# Patient Record
Sex: Female | Born: 1935 | Race: White | Hispanic: No | Smoking: Never smoker
Health system: Southern US, Community
[De-identification: ages and names within clinical notes are randomized; demographics above are authoritative.]

## PROBLEM LIST (undated history)

## (undated) DIAGNOSIS — I495 Sick sinus syndrome: Secondary | ICD-10-CM

## (undated) DIAGNOSIS — C649 Malignant neoplasm of unspecified kidney, except renal pelvis: Secondary | ICD-10-CM

## (undated) DIAGNOSIS — I629 Nontraumatic intracranial hemorrhage, unspecified: Secondary | ICD-10-CM

## (undated) DIAGNOSIS — E785 Hyperlipidemia, unspecified: Secondary | ICD-10-CM

## (undated) DIAGNOSIS — I341 Nonrheumatic mitral (valve) prolapse: Secondary | ICD-10-CM

## (undated) DIAGNOSIS — I4821 Permanent atrial fibrillation: Secondary | ICD-10-CM

## (undated) DIAGNOSIS — K279 Peptic ulcer, site unspecified, unspecified as acute or chronic, without hemorrhage or perforation: Secondary | ICD-10-CM

## (undated) DIAGNOSIS — M1990 Unspecified osteoarthritis, unspecified site: Secondary | ICD-10-CM

## (undated) DIAGNOSIS — I69959 Hemiplegia and hemiparesis following unspecified cerebrovascular disease affecting unspecified side: Secondary | ICD-10-CM

## (undated) DIAGNOSIS — I10 Essential (primary) hypertension: Secondary | ICD-10-CM

## (undated) DIAGNOSIS — I350 Nonrheumatic aortic (valve) stenosis: Secondary | ICD-10-CM

## (undated) DIAGNOSIS — E039 Hypothyroidism, unspecified: Secondary | ICD-10-CM

## (undated) HISTORY — PX: NEPHRECTOMY: SHX65

## (undated) HISTORY — DX: Unspecified osteoarthritis, unspecified site: M19.90

## (undated) HISTORY — DX: Malignant neoplasm of unspecified kidney, except renal pelvis: C64.9

## (undated) HISTORY — DX: Nontraumatic intracranial hemorrhage, unspecified: I62.9

## (undated) HISTORY — DX: Nonrheumatic aortic (valve) stenosis: I35.0

## (undated) HISTORY — PX: BACK SURGERY: SHX140

## (undated) HISTORY — DX: Essential (primary) hypertension: I10

## (undated) HISTORY — DX: Hyperlipidemia, unspecified: E78.5

## (undated) HISTORY — DX: Hemiplegia and hemiparesis following unspecified cerebrovascular disease affecting unspecified side: I69.959

## (undated) HISTORY — DX: Peptic ulcer, site unspecified, unspecified as acute or chronic, without hemorrhage or perforation: K27.9

## (undated) HISTORY — PX: TONSILLECTOMY: SUR1361

## (undated) HISTORY — DX: Sick sinus syndrome: I49.5

## (undated) HISTORY — DX: Nonrheumatic mitral (valve) prolapse: I34.1

## (undated) HISTORY — DX: Hypothyroidism, unspecified: E03.9

## (undated) HISTORY — DX: Permanent atrial fibrillation: I48.21

---

## 1998-08-21 ENCOUNTER — Ambulatory Visit (HOSPITAL_COMMUNITY): Admission: RE | Admit: 1998-08-21 | Discharge: 1998-08-21 | Payer: Self-pay | Admitting: Cardiology

## 1998-11-16 ENCOUNTER — Encounter: Payer: Self-pay | Admitting: Emergency Medicine

## 1998-11-17 ENCOUNTER — Observation Stay (HOSPITAL_COMMUNITY): Admission: EM | Admit: 1998-11-17 | Discharge: 1998-11-18 | Payer: Self-pay | Admitting: Emergency Medicine

## 1999-06-18 ENCOUNTER — Emergency Department (HOSPITAL_COMMUNITY): Admission: EM | Admit: 1999-06-18 | Discharge: 1999-06-18 | Payer: Self-pay | Admitting: *Deleted

## 1999-06-22 ENCOUNTER — Inpatient Hospital Stay (HOSPITAL_COMMUNITY): Admission: EM | Admit: 1999-06-22 | Discharge: 1999-06-23 | Payer: Self-pay | Admitting: Emergency Medicine

## 1999-06-22 ENCOUNTER — Encounter: Payer: Self-pay | Admitting: Emergency Medicine

## 2000-06-02 ENCOUNTER — Emergency Department (HOSPITAL_COMMUNITY): Admission: EM | Admit: 2000-06-02 | Discharge: 2000-06-02 | Payer: Self-pay | Admitting: Emergency Medicine

## 2000-06-05 ENCOUNTER — Encounter: Payer: Self-pay | Admitting: Internal Medicine

## 2000-06-05 ENCOUNTER — Encounter: Payer: Self-pay | Admitting: Emergency Medicine

## 2000-06-05 ENCOUNTER — Encounter (INDEPENDENT_AMBULATORY_CARE_PROVIDER_SITE_OTHER): Payer: Self-pay | Admitting: *Deleted

## 2000-06-05 ENCOUNTER — Inpatient Hospital Stay (HOSPITAL_COMMUNITY): Admission: EM | Admit: 2000-06-05 | Discharge: 2000-06-14 | Payer: Self-pay | Admitting: Emergency Medicine

## 2000-06-08 ENCOUNTER — Encounter: Payer: Self-pay | Admitting: Endocrinology

## 2000-11-09 ENCOUNTER — Inpatient Hospital Stay (HOSPITAL_COMMUNITY): Admission: RE | Admit: 2000-11-09 | Discharge: 2000-11-10 | Payer: Self-pay | Admitting: Orthopaedic Surgery

## 2001-01-06 ENCOUNTER — Emergency Department (HOSPITAL_COMMUNITY): Admission: EM | Admit: 2001-01-06 | Discharge: 2001-01-06 | Payer: Self-pay | Admitting: Emergency Medicine

## 2001-01-06 ENCOUNTER — Encounter: Payer: Self-pay | Admitting: Emergency Medicine

## 2001-02-09 ENCOUNTER — Encounter: Payer: Self-pay | Admitting: Urology

## 2001-02-09 ENCOUNTER — Encounter: Admission: RE | Admit: 2001-02-09 | Discharge: 2001-02-09 | Payer: Self-pay | Admitting: Urology

## 2002-01-26 ENCOUNTER — Encounter: Payer: Self-pay | Admitting: Urology

## 2002-01-26 ENCOUNTER — Encounter: Admission: RE | Admit: 2002-01-26 | Discharge: 2002-01-26 | Payer: Self-pay | Admitting: Urology

## 2002-12-20 ENCOUNTER — Ambulatory Visit (HOSPITAL_COMMUNITY): Admission: RE | Admit: 2002-12-20 | Discharge: 2002-12-20 | Payer: Self-pay | Admitting: Urology

## 2002-12-20 ENCOUNTER — Encounter: Payer: Self-pay | Admitting: Urology

## 2003-01-30 ENCOUNTER — Encounter: Admission: RE | Admit: 2003-01-30 | Discharge: 2003-01-30 | Payer: Self-pay | Admitting: Endocrinology

## 2003-01-30 ENCOUNTER — Encounter: Payer: Self-pay | Admitting: Endocrinology

## 2004-01-07 ENCOUNTER — Ambulatory Visit (HOSPITAL_COMMUNITY): Admission: RE | Admit: 2004-01-07 | Discharge: 2004-01-07 | Payer: Self-pay | Admitting: *Deleted

## 2004-01-07 ENCOUNTER — Encounter (INDEPENDENT_AMBULATORY_CARE_PROVIDER_SITE_OTHER): Payer: Self-pay | Admitting: Specialist

## 2004-02-20 ENCOUNTER — Encounter: Admission: RE | Admit: 2004-02-20 | Discharge: 2004-02-20 | Payer: Self-pay | Admitting: Endocrinology

## 2004-05-21 ENCOUNTER — Ambulatory Visit (HOSPITAL_COMMUNITY): Admission: RE | Admit: 2004-05-21 | Discharge: 2004-05-21 | Payer: Self-pay | Admitting: *Deleted

## 2005-03-11 ENCOUNTER — Ambulatory Visit (HOSPITAL_COMMUNITY): Admission: RE | Admit: 2005-03-11 | Discharge: 2005-03-11 | Payer: Self-pay | Admitting: *Deleted

## 2005-03-11 ENCOUNTER — Encounter (INDEPENDENT_AMBULATORY_CARE_PROVIDER_SITE_OTHER): Payer: Self-pay | Admitting: *Deleted

## 2005-05-20 ENCOUNTER — Ambulatory Visit (HOSPITAL_COMMUNITY): Admission: RE | Admit: 2005-05-20 | Discharge: 2005-05-20 | Payer: Self-pay | Admitting: Urology

## 2006-05-23 ENCOUNTER — Ambulatory Visit (HOSPITAL_COMMUNITY): Admission: RE | Admit: 2006-05-23 | Discharge: 2006-05-23 | Payer: Self-pay | Admitting: Urology

## 2006-06-17 ENCOUNTER — Emergency Department (HOSPITAL_COMMUNITY): Admission: EM | Admit: 2006-06-17 | Discharge: 2006-06-17 | Payer: Self-pay | Admitting: Emergency Medicine

## 2006-06-23 ENCOUNTER — Emergency Department (HOSPITAL_COMMUNITY): Admission: EM | Admit: 2006-06-23 | Discharge: 2006-06-23 | Payer: Self-pay | Admitting: Emergency Medicine

## 2006-07-28 ENCOUNTER — Emergency Department (HOSPITAL_COMMUNITY): Admission: EM | Admit: 2006-07-28 | Discharge: 2006-07-28 | Payer: Self-pay | Admitting: Emergency Medicine

## 2006-08-18 ENCOUNTER — Inpatient Hospital Stay (HOSPITAL_COMMUNITY): Admission: EM | Admit: 2006-08-18 | Discharge: 2006-08-24 | Payer: Self-pay | Admitting: Emergency Medicine

## 2006-08-18 ENCOUNTER — Encounter (INDEPENDENT_AMBULATORY_CARE_PROVIDER_SITE_OTHER): Payer: Self-pay | Admitting: Cardiology

## 2006-08-19 ENCOUNTER — Encounter (INDEPENDENT_AMBULATORY_CARE_PROVIDER_SITE_OTHER): Payer: Self-pay | Admitting: Neurology

## 2006-08-24 ENCOUNTER — Inpatient Hospital Stay (HOSPITAL_COMMUNITY)
Admission: RE | Admit: 2006-08-24 | Discharge: 2006-09-02 | Payer: Self-pay | Admitting: Physical Medicine & Rehabilitation

## 2006-08-24 ENCOUNTER — Ambulatory Visit: Payer: Self-pay | Admitting: Physical Medicine & Rehabilitation

## 2006-09-21 ENCOUNTER — Emergency Department (HOSPITAL_COMMUNITY): Admission: EM | Admit: 2006-09-21 | Discharge: 2006-09-21 | Payer: Self-pay | Admitting: Emergency Medicine

## 2006-09-26 ENCOUNTER — Emergency Department (HOSPITAL_COMMUNITY): Admission: EM | Admit: 2006-09-26 | Discharge: 2006-09-26 | Payer: Self-pay | Admitting: Emergency Medicine

## 2006-09-26 ENCOUNTER — Ambulatory Visit (HOSPITAL_COMMUNITY): Admission: RE | Admit: 2006-09-26 | Discharge: 2006-09-26 | Payer: Self-pay | Admitting: *Deleted

## 2006-10-12 ENCOUNTER — Encounter
Admission: RE | Admit: 2006-10-12 | Discharge: 2007-01-10 | Payer: Self-pay | Admitting: Physical Medicine & Rehabilitation

## 2006-10-12 ENCOUNTER — Ambulatory Visit: Payer: Self-pay | Admitting: Physical Medicine & Rehabilitation

## 2006-11-24 ENCOUNTER — Encounter (INDEPENDENT_AMBULATORY_CARE_PROVIDER_SITE_OTHER): Payer: Self-pay | Admitting: *Deleted

## 2006-11-24 ENCOUNTER — Ambulatory Visit (HOSPITAL_COMMUNITY): Admission: RE | Admit: 2006-11-24 | Discharge: 2006-11-24 | Payer: Self-pay | Admitting: *Deleted

## 2007-03-06 ENCOUNTER — Ambulatory Visit (HOSPITAL_COMMUNITY): Admission: RE | Admit: 2007-03-06 | Discharge: 2007-03-06 | Payer: Self-pay | Admitting: Urology

## 2007-04-30 IMAGING — CT CT ANGIO CHEST
2 of 5 series · 19 of 36 positions shown · IV contrast (120 ML OMNI 300)
Comparison: none

CLINICAL DATA: Left chest pain.  Elevated D-Dimer.  Evaluate for pulmonary embolus.  
 CT ANGIOGRAPHY OF CHEST:
TECHNIQUE: Multidetector CT imaging of the chest was performed during bolus injection of intravenous contrast.  Multiplanar CT angiographic image reconstructions were generated to evaluate the vascular anatomy.
 Contrast:  83 ml Omnipaque 300 IV.

[Series 3: pe · axial · 0.72mm/px · z∈[-253,+21]mm · 16 of 249 slices shown]
[im 15/249  lung]
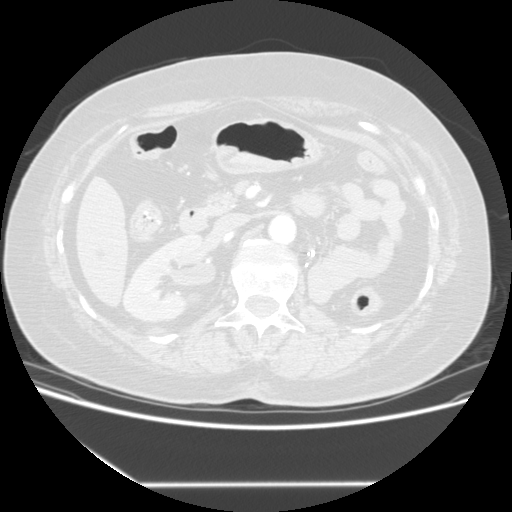
[im 30/249  mediastinal]
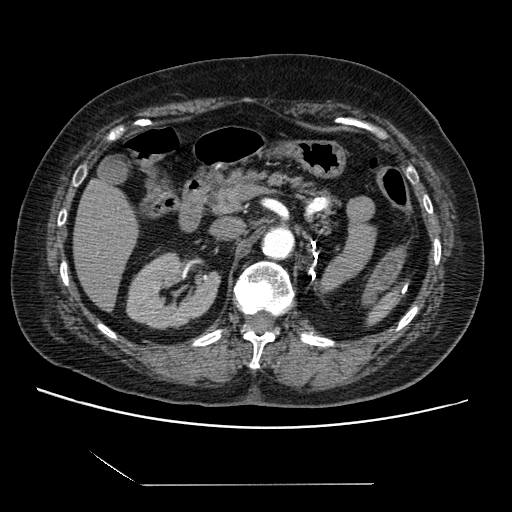
[im 44/249  lung]
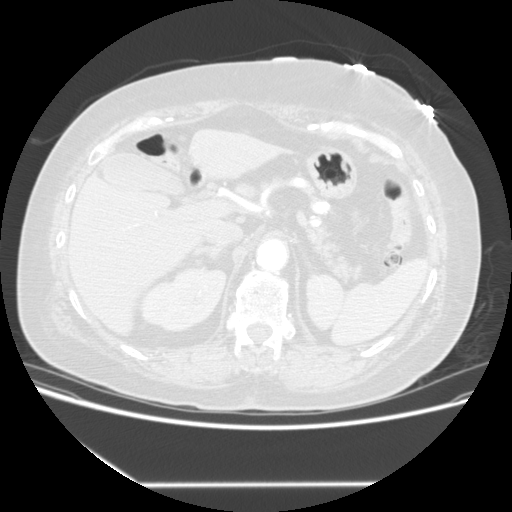
[im 59/249  mediastinal]
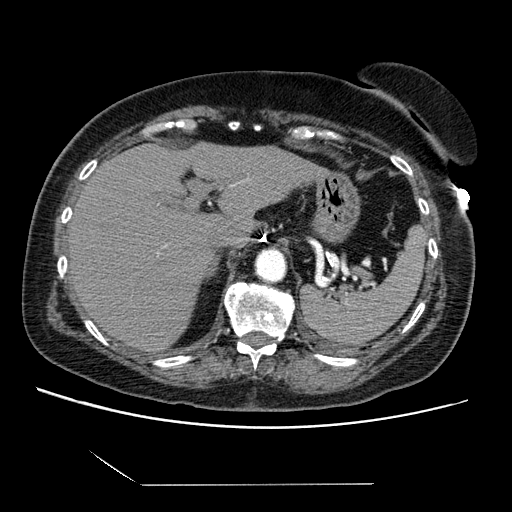
[im 73/249  lung]
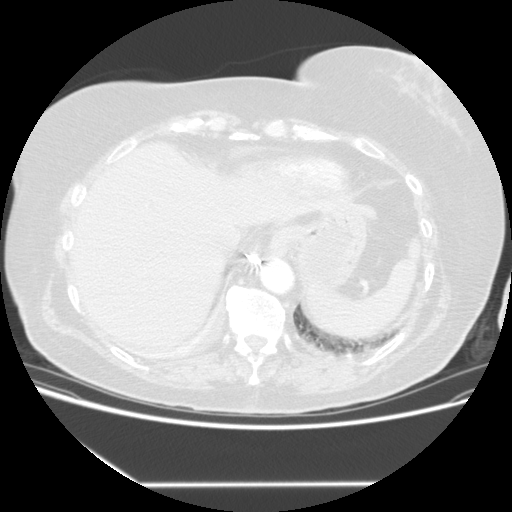
[im 88/249  mediastinal]
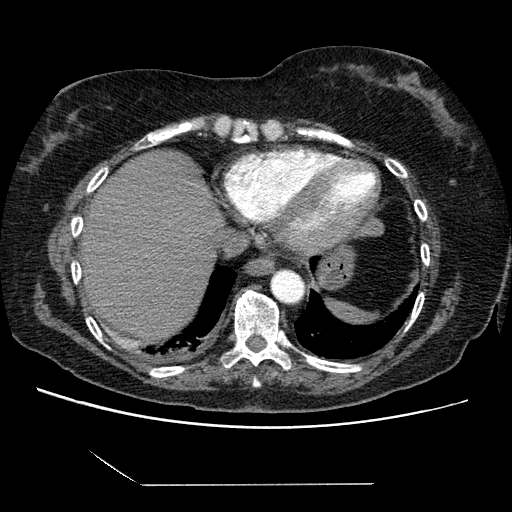
[im 103/249  lung]
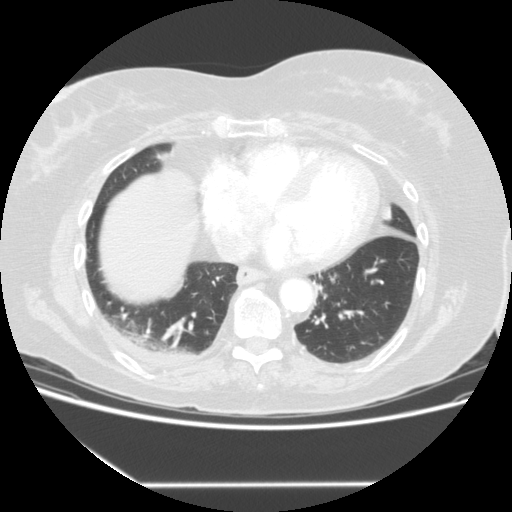
[im 117/249  mediastinal]
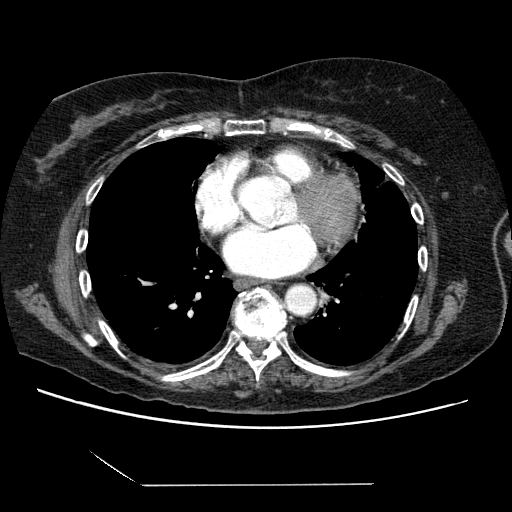
[im 132/249  lung]
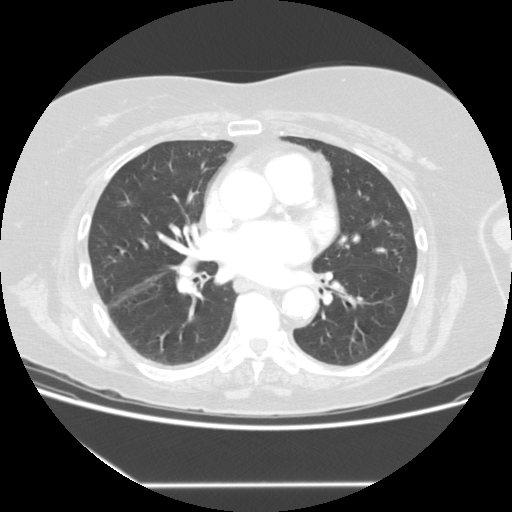
[im 146/249  mediastinal]
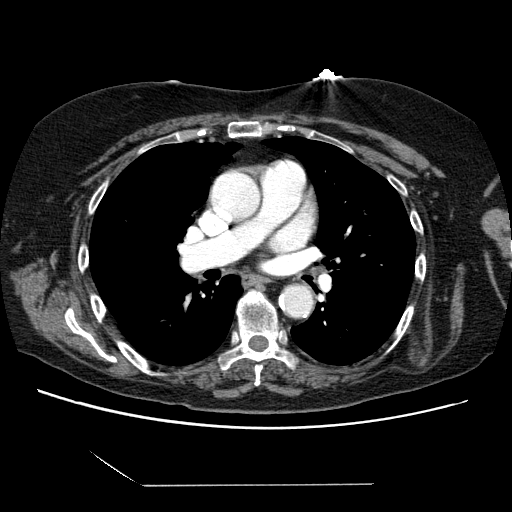
[im 161/249  lung]
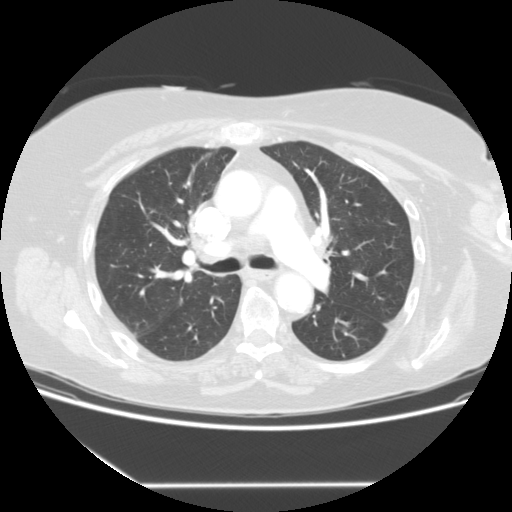
[im 176/249  mediastinal]
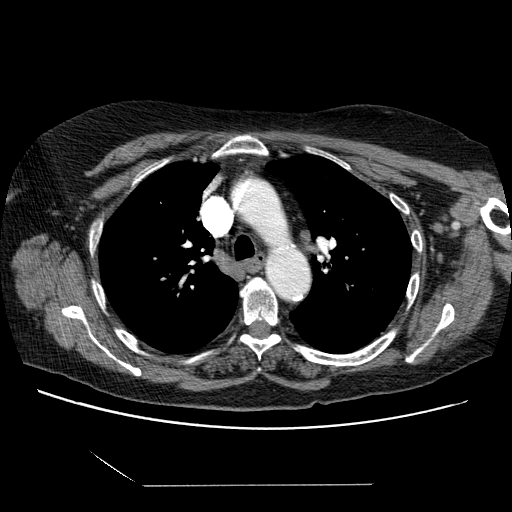
[im 190/249  lung]
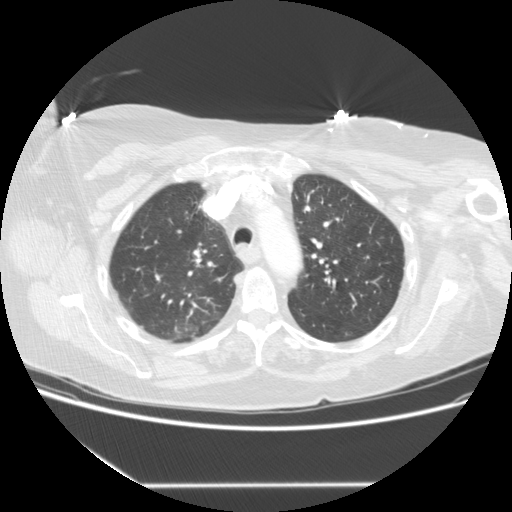
[im 205/249  mediastinal]
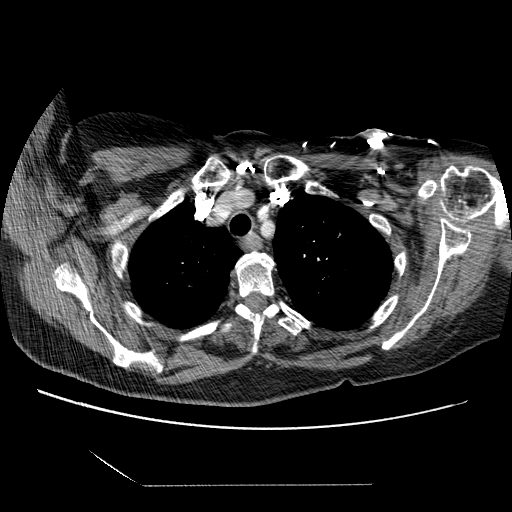
[im 219/249  lung]
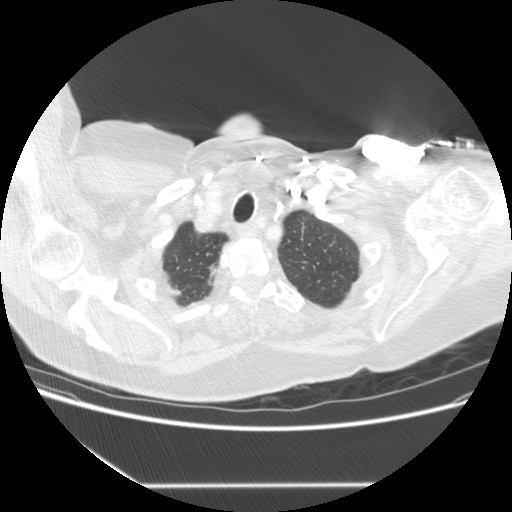
[im 234/249  mediastinal]
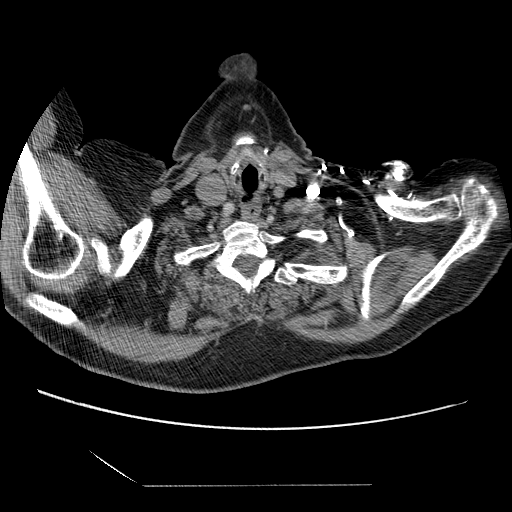

[Series 301: coronal · coronal · 0.72mm/px · 3 of 114 slices shown]
[im 23/114  mediastinal]
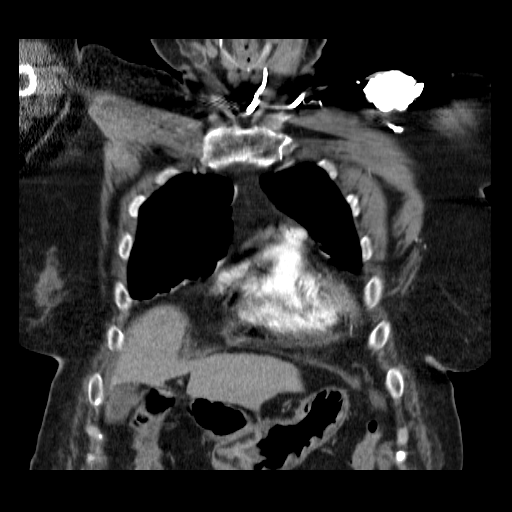
[im 46/114  mediastinal]
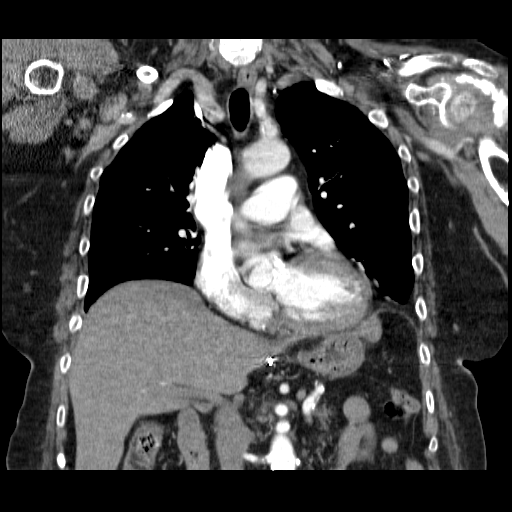
[im 68/114  mediastinal]
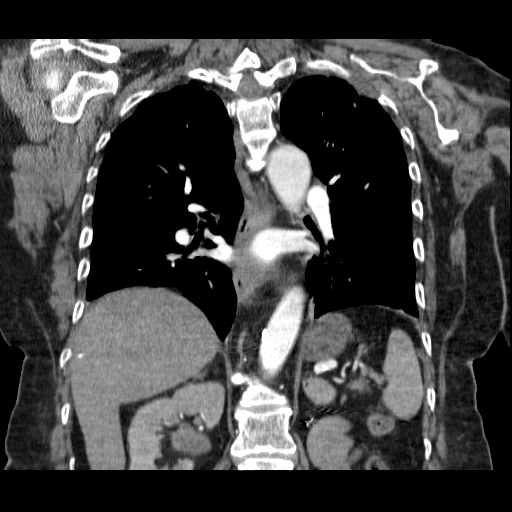

[19 of 36 positions shown; findings below may reference images not displayed]

FINDINGS: The patient had poor IV access.  IV was placed by IV Therapy in the left anterior shoulder.  This flushed well and test injections were successful.  Towards the end of the contrast injection there was a small extravasation of approximately 10 to 12 cc of contrast in the subcutaneous tissues measuring approximately 2 X 4 cm.  Close follow-up for this is suggested to be sure there are no skin changes or adverse affects from the extravasation.  
 There is adequate vascular opacification as most of the contrast was intravascular during the injection. 
 Negative for pulmonary emboli.  There is a small right effusion and mild right lower lobe atelectasis.  No lung mass or nodule is identified.  There has been a left nephrectomy.  There is no mediastinal adenopathy.
IMPRESSION: 1.  Negative for pulmonary emboli. 
 2.  Small right effusion and mild right lower lobe atelectasis.  
 3.  Extravasation in the left anterior chest of approximately 10 to 12 cc of Omnipaque contrast.

## 2007-12-07 ENCOUNTER — Emergency Department (HOSPITAL_COMMUNITY): Admission: EM | Admit: 2007-12-07 | Discharge: 2007-12-07 | Payer: Self-pay | Admitting: Emergency Medicine

## 2008-02-02 ENCOUNTER — Emergency Department (HOSPITAL_COMMUNITY): Admission: EM | Admit: 2008-02-02 | Discharge: 2008-02-02 | Payer: Self-pay | Admitting: Emergency Medicine

## 2008-04-15 ENCOUNTER — Ambulatory Visit (HOSPITAL_COMMUNITY): Admission: RE | Admit: 2008-04-15 | Discharge: 2008-04-15 | Payer: Self-pay | Admitting: Urology

## 2009-03-24 ENCOUNTER — Encounter: Admission: RE | Admit: 2009-03-24 | Discharge: 2009-03-24 | Payer: Self-pay | Admitting: Orthopedic Surgery

## 2009-04-17 ENCOUNTER — Ambulatory Visit (HOSPITAL_COMMUNITY): Admission: RE | Admit: 2009-04-17 | Discharge: 2009-04-17 | Payer: Self-pay | Admitting: Urology

## 2010-08-02 ENCOUNTER — Encounter: Payer: Self-pay | Admitting: Cardiology

## 2010-08-03 ENCOUNTER — Inpatient Hospital Stay (HOSPITAL_COMMUNITY)
Admission: EM | Admit: 2010-08-03 | Discharge: 2010-08-08 | Payer: Self-pay | Source: Home / Self Care | Attending: Internal Medicine | Admitting: Internal Medicine

## 2010-08-03 ENCOUNTER — Encounter: Payer: Self-pay | Admitting: Orthopedic Surgery

## 2010-08-04 LAB — POCT CARDIAC MARKERS
CKMB, poc: 2 ng/mL (ref 1.0–8.0)
Myoglobin, poc: 462 ng/mL (ref 12–200)

## 2010-08-04 LAB — DIFFERENTIAL
Basophils Absolute: 0 10*3/uL (ref 0.0–0.1)
Basophils Relative: 0 % (ref 0–1)
Eosinophils Relative: 0 % (ref 0–5)
Lymphocytes Relative: 6 % — ABNORMAL LOW (ref 12–46)
Neutro Abs: 6.8 10*3/uL (ref 1.7–7.7)

## 2010-08-04 LAB — HEPATIC FUNCTION PANEL
ALT: 11 U/L (ref 0–35)
AST: 22 U/L (ref 0–37)
Alkaline Phosphatase: 76 U/L (ref 39–117)
Bilirubin, Direct: 0.1 mg/dL (ref 0.0–0.3)
Total Bilirubin: 0.5 mg/dL (ref 0.3–1.2)

## 2010-08-04 LAB — MRSA PCR SCREENING: MRSA by PCR: NEGATIVE

## 2010-08-04 LAB — CBC
HCT: 42.8 % (ref 36.0–46.0)
Platelets: 230 10*3/uL (ref 150–400)
RDW: 12.6 % (ref 11.5–15.5)
WBC: 7.9 10*3/uL (ref 4.0–10.5)

## 2010-08-04 LAB — POCT I-STAT, CHEM 8
BUN: 11 mg/dL (ref 6–23)
Calcium, Ion: 1.05 mmol/L — ABNORMAL LOW (ref 1.12–1.32)
Glucose, Bld: 184 mg/dL — ABNORMAL HIGH (ref 70–99)
HCT: 44 % (ref 36.0–46.0)
Sodium: 135 mEq/L (ref 135–145)
TCO2: 30 mmol/L (ref 0–100)

## 2010-08-04 LAB — HEPARIN LEVEL (UNFRACTIONATED): Heparin Unfractionated: 0.63 IU/mL (ref 0.30–0.70)

## 2010-08-04 LAB — CK TOTAL AND CKMB (NOT AT ARMC): CK, MB: 3 ng/mL (ref 0.3–4.0)

## 2010-08-05 LAB — LIPID PANEL
Cholesterol: 175 mg/dL (ref 0–200)
Total CHOL/HDL Ratio: 3.6 RATIO

## 2010-08-05 LAB — CARDIAC PANEL(CRET KIN+CKTOT+MB+TROPI)
CK, MB: 3 ng/mL (ref 0.3–4.0)
Total CK: 164 U/L (ref 7–177)
Troponin I: 0.06 ng/mL (ref 0.00–0.06)

## 2010-08-05 LAB — CBC
MCH: 31.8 pg (ref 26.0–34.0)
MCHC: 32.6 g/dL (ref 30.0–36.0)
MCV: 97.4 fL (ref 78.0–100.0)
MCV: 97.1 fL (ref 78.0–100.0)
Platelets: 178 10*3/uL (ref 150–400)
Platelets: 161 10*3/uL (ref 150–400)
RBC: 3.81 MIL/uL — ABNORMAL LOW (ref 3.87–5.11)
RDW: 12.7 % (ref 11.5–15.5)
WBC: 5.4 10*3/uL (ref 4.0–10.5)

## 2010-08-05 LAB — BASIC METABOLIC PANEL
BUN: 18 mg/dL (ref 6–23)
Calcium: 8.6 mg/dL (ref 8.4–10.5)
Creatinine, Ser: 1.24 mg/dL — ABNORMAL HIGH (ref 0.4–1.2)
GFR calc Af Amer: >60 mL/min (ref >60)
GFR calc non Af Amer: 42 mL/min — ABNORMAL LOW (ref >60)
GFR calc non Af Amer: 52 mL/min — ABNORMAL LOW (ref >60)
Glucose, Bld: 106 mg/dL — ABNORMAL HIGH (ref 70–99)
Glucose, Bld: 106 mg/dL — ABNORMAL HIGH (ref 70–99)
Potassium: 3.7 mEq/L (ref 3.5–5.1)
Sodium: 137 mEq/L (ref 135–145)

## 2010-08-05 LAB — DIFFERENTIAL
Basophils Absolute: 0 10*3/uL (ref 0.0–0.1)
Eosinophils Absolute: 0.1 10*3/uL (ref 0.0–0.7)
Eosinophils Relative: 2 % (ref 0–5)
Lymphocytes Relative: 34 % (ref 12–46)
Lymphs Abs: 1.8 10*3/uL (ref 0.7–4.0)
Neutrophils Relative %: 49 % (ref 43–77)

## 2010-08-05 LAB — HEPARIN LEVEL (UNFRACTIONATED)
Heparin Unfractionated: 0.86 IU/mL — ABNORMAL HIGH (ref 0.30–0.70)
Heparin Unfractionated: 0.69 IU/mL (ref 0.30–0.70)

## 2010-08-05 LAB — MAGNESIUM: Magnesium: 2 mg/dL (ref 1.5–2.5)

## 2010-08-05 LAB — HEMOGLOBIN AND HEMATOCRIT, BLOOD
HCT: 35.7 % — ABNORMAL LOW (ref 36.0–46.0)
Hemoglobin: 11.7 g/dL — ABNORMAL LOW (ref 12.0–15.0)

## 2010-08-06 LAB — CBC
Hemoglobin: 12.3 g/dL (ref 12.0–15.0)
MCH: 31.6 pg (ref 26.0–34.0)
MCHC: 32.3 g/dL (ref 30.0–36.0)
Platelets: 151 10*3/uL (ref 150–400)
RDW: 12.8 % (ref 11.5–15.5)

## 2010-08-06 LAB — HEMOGLOBIN AND HEMATOCRIT, BLOOD
HCT: 35.8 % — ABNORMAL LOW (ref 36.0–46.0)
Hemoglobin: 11.7 g/dL — ABNORMAL LOW (ref 12.0–15.0)
Hemoglobin: 11.7 g/dL — ABNORMAL LOW (ref 12.0–15.0)

## 2010-08-07 LAB — BASIC METABOLIC PANEL
BUN: 10 mg/dL (ref 6–23)
Calcium: 9.3 mg/dL (ref 8.4–10.5)
Creatinine, Ser: 1.09 mg/dL (ref 0.4–1.2)
GFR calc Af Amer: 59 mL/min — ABNORMAL LOW (ref >60)

## 2010-08-07 LAB — CBC
MCH: 31.3 pg (ref 26.0–34.0)
MCHC: 31.8 g/dL (ref 30.0–36.0)
MCV: 98.5 fL (ref 78.0–100.0)
Platelets: 164 10*3/uL (ref 150–400)

## 2010-08-07 NOTE — H&P (Signed)
[name], [name] NO.:  [id number]  MEDICAL RECORD NO.:  [id number]          PATIENT TYPE:  EMS  LOCATION:  [facility]JO                         FACILITY:  [facility]  PHYSICIAN:  Vania Rea, M.D. DATE OF BIRTH:  July 16, 1932  DATE OF ADMISSION:  08/03/2010 DATE OF DISCHARGE:                             HISTORY & PHYSICAL   PRIMARY CARE PHYSICIAN:  Dr. Adela Lank.  CARDIOLOGIST:  Dr. Noralee Stain and Dr. Aleen Campi.  CHIEF COMPLAINT:  Chest discomfort, nausea and vomiting for 3 days.  HISTORY OF PRESENT ILLNESS:  This is a 77 year old Caucasian lady with a history of paroxysmal atrial fibrillation, hypertension and hyperthyroidism who has been in good baseline state of health, but for the past 3 days has reported persistent nausea, vomiting and chest discomfort radiating to the right side of her chest.  It was associated with dizziness and diaphoresis when standing, but she had no fever, no cough.  Eventually, she went to her primary care physician this morning who did an EKG, noted she was in atrial fibrillation with rapid response.  Gave her aspirin and sublingual nitroglycerin.  She did report some relief, but he transported her to the [facility] Emergency Room for further evaluation.  In the [facility] Emergency Room, the patient has been started on heparin drip and nitroglycerin drip with improved rate control and she is now feeling much better.  The patient denies vomiting of any bloody or black material.  She denies passage of any bloody or black stool.  PAST MEDICAL HISTORY:  Hypertension, hyperlipidemia, paroxysmal atrial fibrillation, hypothyroidism, history of mitral valve prolapse.  She is status post nephrectomy for renal cell carcinoma in 2001.  She is status post remote peptic ulcer disease surgery, status post back surgery for degenerative joint disease, status post tonsillectomy.  MEDICATIONS:  Include; 1. Toprol XL 100 mg at bedtime. 2. Cardizem  CD 300 mg daily. 3. Amitriptyline 25 mg daily. 4. Xanax 0.25 mg when necessary. 5. Crestor 20 mg daily. 6. Synthroid 88 mcg daily. 7. Zegerid 1 tablet daily. 8. Lisinopril 20 mg daily. 9. Allegra 180 mg daily. 10.Vicodin one every 6 hours p.r.n. for pain. 11.Allergy injection weekly.  ALLERGIES:  Reports allergies to HYDROCODONE and CODEINE, which cause nausea and vomiting.  SOCIAL HISTORY:  Denies tobacco, alcohol or illicit drug use.  She lives alone, but she has a supportive daughter who is present with her in the emergency room.  CODE STATUS:  She reports herself to full code.  FAMILY HISTORY:  Significant for brother and sister with atrial fibrillation and sister also has a pacemaker.  She has a mother who had a stroke and a father who had coronary artery disease and does otherwise have a strong family history of coronary artery disease.  Review of systems other than noted above unremarkable.  PHYSICAL EXAMINATION:  GENERAL:  Pleasant elderly Caucasian lady lying in the stretcher, mildly obese. VITAL SIGNS:  Temperature is 98.7, pulse initially 143 on presentation, currently 89, respirations 18, blood pressure 120/75.  She is saturating 98% on 2 L. HEENT:  Her pupils are round and equal.  Mucous  membranes pink. Anicteric.  No cervical lymphadenopathy or thyromegaly.  No carotid bruit. CHEST:  Clear to auscultation bilaterally. CARDIOVASCULAR:  Regular rhythm.  No murmur. ABDOMEN:  Obese, soft, nontender. EXTREMITIES:  Without edema.  She has 2+ dorsalis pedis pulses bilaterally. CENTRAL NERVOUS SYSTEM:  Cranial nerves II-XII grossly intact.  She has no focal lateralizing signs.  LABORATORY DATA:  Her white count is 7.9, hemoglobin 14.2, platelets 230.  She has 5% neutrophils, absolute neutrophil is normal, absolute lymphocyte count is 0.5.  There is no comment on the morphology and i- STAT chemistry was done, sodium is 135, potassium 4.2, chloride 97, glucose 184,  BUN 11, creatinine 1.4.  Her cardiac markers show undetectable troponin, CK-MB 2.0, myoglobin elevated at 462.  Portable chest x-ray shows no acute abnormalities.  Her EKG initially shows atrial fibrillation with a rate of 143.  ASSESSMENT: 1. Atrial fibrillation with rapid ventricular response, questionable     due to non-ingestion of rate controlling medications from 3 days of     persistent vomiting. 2. Chest pain, questionable acute coronary syndrome versus acute     gastritis or esophagitis. 3. Hypertension, controlled. 4. Hyperglycemia. 5. Hyperlipidemia. 6. Hypothyroidism.  PLAN: 1. We will admit this lady for continued rate management.  We will put     her in the step-down unit and we will restart her on extended     release Cardizem, but we will get Toprol XL in divided doses.     Chances are she does not need additional doses of rate controlling     medications once she is confirmed to be absorbing her medication. 2. We will give Protonix and Zofran for control of her gastritis and     we will give her clear liquid diet as tolerated and monitor for any     recurrence of gastritis. 3. We will get serial cardiac enzymes, rule out an acute coronary     syndrome. 4. We will check her thyroid function test to rule out the change in     her thyroid status. 5. She has been started on full anticoagulation.  We will continue     this for the time being.  We will get Cardiology evaluation.  Other     plans will be as per orders.     Vania Rea, M.D.     LC/MEDQ  D:  08/03/2010  T:  08/03/2010  Job:  [id number]  cc:   Brooke Bonito, M.D.  Electronically Signed by Vania Rea M.D. on 08/07/2010 10:58:15 PM

## 2010-08-08 LAB — CBC
MCH: 32.2 pg (ref 26.0–34.0)
Platelets: 177 10*3/uL (ref 150–400)
RBC: 3.76 MIL/uL — ABNORMAL LOW (ref 3.87–5.11)
RDW: 12.5 % (ref 11.5–15.5)
WBC: 6.2 10*3/uL (ref 4.0–10.5)

## 2010-08-08 LAB — BASIC METABOLIC PANEL
BUN: 12 mg/dL (ref 6–23)
Chloride: 98 mEq/L (ref 96–112)
Creatinine, Ser: 0.99 mg/dL (ref 0.4–1.2)
GFR calc Af Amer: >60 mL/min (ref >60)
GFR calc non Af Amer: 55 mL/min — ABNORMAL LOW (ref >60)
Potassium: 4.5 mEq/L (ref 3.5–5.1)

## 2010-08-08 LAB — BRAIN NATRIURETIC PEPTIDE: Pro B Natriuretic peptide (BNP): 420 pg/mL — ABNORMAL HIGH (ref 0.0–100.0)

## 2010-08-15 NOTE — Discharge Summary (Signed)
[name], [name]                 ACCOUNT NO.:  [id number]  MEDICAL RECORD NO.:  [id number]          PATIENT TYPE:  INP  LOCATION:  3731                         FACILITY:  [facility]  PHYSICIAN:  Kathlen Mody, MD       DATE OF BIRTH:  1933-05-13  DATE OF ADMISSION:  08/03/2010 DATE OF DISCHARGE:  08/08/2010                              DISCHARGE SUMMARY   PRIMARY CARE PHYSICIAN:  Brooke Bonito, M.D.  CARDIOLOGIST:  Dr. Aleen Campi and Dr. Noralee Stain.  DISCHARGE DIAGNOSES: 1. Tachybrady syndrome. 2. Atrial fibrillation. 3. Hypertension. 4. Hypothyroidism. 5. Mitral valve prolapse. 6. Peptic ulcer disease. 7. Degenerative joint disease. 8. Right cerebral hemorrhage in 2008. 9. Hyperlipidemia. 10.Hypothyroidism. 11.Renal cell carcinomas, status post left nephrectomy in 2001. 12.Peptic ulcer disease, status post endoscopy, cardiac     catheterization 20 years ago.  DISCHARGE MEDICATIONS: 1. Diltiazem 180 mg 1 capsule daily. 2. Amitriptyline 25 mg at bedtime. 3. Crestor 20 mg at bedtime. 4. Levothyroxine 88 mcg daily. 5. Aspirin 81 mg daily. 6. Metoprolol 50 mg twice a day. 7. Protonix 40 mg daily. 8. Lisinopril 20 mg daily.  CONSULTS:  Cardiology consult.  PERTINENT LABORATORY DATA:  The patient had a Chem-8 showing a sodium of 135, potassium of 4.8, chloride of 97, bicarb of 30, glucose of 184, BUN of 11, and creatinine of 1.4.  Point-of-care cardiac markers showed an elevated myoglobin of 407, INR of 0.95.  CBC was pretty much normal. Another set of cardiac markers showed an elevated myoglobin of 462, CK- MB was 3, troponin was 0.05.  Liver function tests were normal.  Thyroid function tests, thyroid stimulating hormone was 1.122, T4 was 1.43. Hemoglobin A1c was 5.8.  MRSA/PCR negative.  CK-MB and troponins were negative.  CBC on August 04, 2010, was pretty much normal.  Lipid profile showed an elevated LDL of 103, HDL of 48.  B-natriuretic peptide on August 08, 2010, showed  420.  Basic metabolic panel on August 08, 2010, showed a sodium of 141, potassium of 45, chloride of 98, bicarb of 34, glucose 103, BUN 12, creatinine 0.99.  CBC was pretty much normal.  RADIOLOGY:  The patient had a chest x-ray on August 03, 2010, showed no active disease.  Another chest x-ray on August 03, 2010, showed borderline enlargement of cardiac silhouette, tortuous aorta, pulmonary vascularity normal, minimal of bronchitic changes, subsegmental atelectasis of right lung base.  No acute infiltrate or effusion. Parenchymal scarring, which is unchanged.  Surgical clips in left upper quadrant was seen, prior left nephrectomy.  BRIEF HOSPITAL COURSE: 1. This is a 77 year old lady with past medical history of paroxysmal     atrial fibrillation, hypertension, hypothyroidism, hyperlipidemia,     and history of cerebral bleed, admitted to the hospital for chest     discomfort, nausea, and vomiting for 3 days prior to coming to the     hospital.  The patient was found to have atrial fibrillation with     rapid ventricular rate.  She was started on IV Cardizem and IV     heparin.  Later on during the second day of  the hospitalization,     she became bradycardic at which time Cardizem drip was stopped and     she was continued on the heparin drip.  Cardiology consult was     called at that time.  The patient 1 day after being on heparin drip     developed blood-tinged sputum and cough, after which the IV heparin     was stopped.  She had a repeat chest x-ray at that time, did not     show any acute pathology.  The patient's CHADS score was 1 and at     this time the patient was started on Cardizem p.o. 180 mg daily.     She was also started on metoprolol.  At first, she became     bradycardic, later metoprolol was stopped and she was slowly     restarted back on metoprolol starting at a dose of 12.5 mg b.i.d.     Later, her metoprolol gradually was increased to 50 mg XL b.i.d. at      which time her rate has been controlled and though she remained in     atrial fibrillation her heart rate has been controlled.  The     patient refused oral anticoagulation because of the history of a     cerebral bleed.  She remained to be on aspirin 81 mg daily. 2. The patient had some bronchitis.  On admission, she had cough and     mild temperature.  She was started on Zithromax 500 mg daily for     about 3 days and completed course without any complication. 3. Hypothyroidism.  The patient was already on Synthroid.  Her home     dose of Synthroid was started.  VITAL SIGNS:  On the day of discharge, she was afebrile with a temperature of 87.6, pulse of 91, respirations 18, blood pressure 125/72, and saturating 92% on room air. GENERAL:  She was alert, afebrile, oriented x3, in no acute distress, sitting in chair. CARDIOVASCULAR:  S1 and S2.  No rubs, murmurs, or gallops.  Regular rhythm. RESPIRATORY:  Good air entry bilaterally. ABDOMEN:  Soft, nontender, and nondistended.  Bowel sounds are good. EXTREMITIES:  No pedal edema. NEUROLOGICAL:  Nonfocal.  The patient is hemodynamically stable for discharge.  She was recommended to follow up with her PCP and the [facility] in about 1-2 weeks.  Time spent with the dictation summary and time spent with the patient on discharge about 30 minutes.          ______________________________ Kathlen Mody, MD     VA/MEDQ  D:  08/09/2010  T:  08/10/2010  Job:  [id number]  Electronically Signed by Kathlen Mody MD on 08/15/2010 11:35:42 PM

## 2010-08-20 DIAGNOSIS — I059 Rheumatic mitral valve disease, unspecified: Secondary | ICD-10-CM | POA: Insufficient documentation

## 2010-08-20 DIAGNOSIS — I69959 Hemiplegia and hemiparesis following unspecified cerebrovascular disease affecting unspecified side: Secondary | ICD-10-CM | POA: Insufficient documentation

## 2010-08-20 DIAGNOSIS — I4891 Unspecified atrial fibrillation: Secondary | ICD-10-CM | POA: Insufficient documentation

## 2010-08-20 DIAGNOSIS — E039 Hypothyroidism, unspecified: Secondary | ICD-10-CM | POA: Insufficient documentation

## 2010-08-20 DIAGNOSIS — I10 Essential (primary) hypertension: Secondary | ICD-10-CM | POA: Insufficient documentation

## 2010-08-20 DIAGNOSIS — C649 Malignant neoplasm of unspecified kidney, except renal pelvis: Secondary | ICD-10-CM | POA: Insufficient documentation

## 2010-08-20 DIAGNOSIS — K279 Peptic ulcer, site unspecified, unspecified as acute or chronic, without hemorrhage or perforation: Secondary | ICD-10-CM | POA: Insufficient documentation

## 2010-08-20 DIAGNOSIS — I495 Sick sinus syndrome: Secondary | ICD-10-CM | POA: Insufficient documentation

## 2010-08-20 DIAGNOSIS — E785 Hyperlipidemia, unspecified: Secondary | ICD-10-CM | POA: Insufficient documentation

## 2010-08-20 DIAGNOSIS — M1990 Unspecified osteoarthritis, unspecified site: Secondary | ICD-10-CM | POA: Insufficient documentation

## 2010-08-28 ENCOUNTER — Encounter: Payer: Self-pay | Admitting: Cardiology

## 2010-08-28 ENCOUNTER — Encounter (INDEPENDENT_AMBULATORY_CARE_PROVIDER_SITE_OTHER): Payer: Medicare Other | Admitting: Cardiology

## 2010-08-28 DIAGNOSIS — I4891 Unspecified atrial fibrillation: Secondary | ICD-10-CM

## 2010-09-01 NOTE — Consult Note (Signed)
[name], [name]                 ACCOUNT NO.:  [id number]  MEDICAL RECORD NO.:  [id number]          PATIENT TYPE:  INP  LOCATION:  3731                         FACILITY:  [facility]  PHYSICIAN:  Colleen Can. Deborah Chalk, M.D.DATE OF BIRTH:  10/20/1932  DATE OF CONSULTATION:  08/04/2010 DATE OF DISCHARGE:                                CONSULTATION   PRIMARY CARDIOLOGIST:  New patient to [facility].  The patient used to be seen by Dr. Lucas Mallow.  PRIMARY CARE PHYSICIAN:  Brooke Bonito, MD  REASON FOR CONSULTATION:  Questionable tachybrady syndrome.  PAST MEDICAL HISTORY: 1. Hypertension. 2. Right parietal hemorrhage in 2008, no residual side effects. 3. Hyperlipidemia. 4. Atrial fibrillation, unsure if this is paroxysmal or permanent. 5. Hyperlipidemia. 6. Hypothyroidism. 7. History of mitral valve prolapse. 8. History of renal cell carcinoma status post left nephrectomy in     2001. 9. Degenerative joint disease. 10.Peptic ulcer disease status post upper endoscopy. 11.Status post back surgery. 12.Status post tonsillectomy. 13.History of cardiac catheterization approximately 20 years ago, this     was normal per the patient. 14.Status post echocardiogram in 2008, showing a left ventricular     ejection fraction of 60%.  HISTORY OF PRESENT ILLNESS:  This is a 77 year old Caucasian female with the above-stated problem list, who presented to her primary care physician with complaints of 3 days of nausea and vomiting.  This was also associated with dizziness and diaphoresis as well as elevated heart rate.  Upon evaluation, the patient was found to be in atrial fibrillation with rapid ventricular response and was sent to the [facility] Emergency Department for further treatment.  In the emergency department, the patient was found to be in AFib with RVR at a rate of 143 beats per minute.  The patient was started on Cardizem drip as well as heparin per pharmacy.  The patient's nausea and  vomiting have subsided.  Her rates are currently controlled.  During the evening, the patient did experience bradycardia, the nurses noted pulse in the 30s. Therefore, her Cardizem drip was discontinued and Cardiology was consulted for questionable tachybrady syndrome and possible pacemaker placement.  The patient was asymptomatic during this episode and was unaware that she had become bradycardic.  Upon evaluation of the telemetry, the patient has had no further episodes of bradycardia.  She does remain in atrial fibrillation, but her rates are well controlled on oral Cardizem and metoprolol.  The patient is unsure if she has chronic atrial fibrillation.  During the times that her rates are controlled, she is asymptomatic.  Again, when rates elevate, she becomes dizzy and presyncopal as well as diaphoretic.  She also has complaints of chest pain that radiated to her left side during this time.  She does not recall these symptoms unless her heart rate is elevated.  The patient also endorses chills.  She denies any known fevers.  She is also complaining of a nonproductive cough.  She has had congestion.  SOCIAL HISTORY:  The patient lives in [place] by herself.  She has a daughter who lives close by and does watch her closely.  She  denies any tobacco, alcohol, or illicit drug use.  FAMILY HISTORY:  Noncontributory for early coronary artery disease.  Her mother passed away at the age of 75 from a stroke.  Her father passed away at the age 46 from old age.  She has siblings with atrial fibrillation and one sister that has a pacemaker.  ALLERGIES: 1. VICODIN. 2. CODEINE.  INPATIENT MEDICATIONS: 1. Amitriptyline 25 mg daily. 2. Diltiazem 180 mg daily. 3. Heparin drip. 4. Lopressor 25 mg q.6 h. 5. Protonix 40 mg daily.  REVIEW OF SYSTEMS:  All pertinent positives and negatives as stated in the HPI.  Other systems have been reviewed and are negative.  PHYSICAL EXAMINATION:  VITAL  SIGNS:  Temperature 98.2, pulse 67-99, respirations 20-24, blood pressure 103-143/46-88, O2 saturation 94% on 2 L. GENERAL:  This is a well-developed, well-nourished, elderly female.  She is in no acute distress. HEENT:  Normal. NECK:  Supple without bruit or JVD. HEART:  Irregularly irregular with S1 and S2.  There is a soft systolic murmur.  No rubs or gallops.  PMI is normal.  Pulses are 2+ and equal bilaterally. LUNGS:  Clear to auscultation bilaterally without wheezes, rales, or rhonchi. ABDOMEN:  Soft, nontender, positive bowel sounds x4. EXTREMITIES:  No clubbing, cyanosis, or edema. MUSCULOSKELETAL:  No joint deformities or effusions. NEUROLOGIC:  Alert and oriented x3.  Cranial nerves II through XII grossly intact.  Chest x-ray showing no active cardiopulmonary disease.  EKG showing atrial fibrillation with a rapid ventricular response at 143 beats per minute.  Axis is normal.  There are no ischemic changes.  LABORATORY DATA:  WBC 8.2, hemoglobin 12.4, hematocrit 38, platelet 178. Sodium 139, potassium 3.8, chloride 99, bicarb 29, BUN 18, creatinine 1.24.  TSH 1.122.  Cardiac enzymes negative x2.  Total cholesterol 175, triglycerides 118, HDL 48, LDL 103.  ASSESSMENT AND PLAN:  This is a 77 year old female with history of paroxysmal atrial fibrillation, hypertension, hyperlipidemia, who presents with atrial fibrillation with rapid ventricular response and questionable gastritis plus or minus bronchitis.  It is difficult to know exactly what is going on.  We recommend monitoring the patient closely on telemetry and continue heparin at this time.  We would also recommend treating underlying gastritis/bronchitis.  At this time, the patient does not appear to need a permanent pacer.  In discussing this with her, we doubt that she would accept a pacer any ways.  With a history of cerebral bleed, the patient may not be a candidate for long- term anticoagulation.  We would  recommend starting a baby aspirin at the time of discharge.  The patient's CHADS 2 score is 1.  We are trying to get a hold of the patient's primary care records to see if atrial fibrillation is permanent versus paroxysmal.  We will also await echo results as these are pending at this time.  Of note, the patient does take Synthroid at home and this will be added back to her medical regimen.     Leonette Monarch, PA-C   ______________________________ Colleen Can Deborah Chalk, M.D.    NB/MEDQ  D:  08/04/2010  T:  08/05/2010  Job:  [id number]  Electronically Signed by Alen Blew P.A. on 08/12/2010 12:47:34 PM Electronically Signed by Roger Shelter M.D. on 09/01/2010 09:13:06 AM

## 2010-09-02 NOTE — Assessment & Plan Note (Signed)
Summary: eph per Dennie Bible from Dr. Marylen Ponto office pt was seen in hospital b...   Visit Type:  eph Primary Provider:  Dr. Juleen China  CC:  tightness in chest. sob. cramping in left leg at times while walking.Marland Kitchen  History of Present Illness: Mrs Mcgloin returns for post hospital visit for PAF. Please refer to discharge summary for details. She is stil having some palpitations but no CP or SOB. She refused anticoagulaton because hx of right parietal bleed in 2008. She is on ASA.  Current Medications (verified): 1)  Amitriptyline Hcl 25 Mg Tabs (Amitriptyline Hcl) .Marland Kitchen.. 1 Tab At Bedtime 2)  Aspirin 81 Mg Tbec (Aspirin) .... Take One Tablet By Mouth Daily 3)  Diltiazem Hcl Er Beads 180 Mg Xr24h-Cap (Diltiazem Hcl Er Beads) .... Take 1 Tablet By Mouth Once A Day 4)  Levothroid 88 Mcg Tabs (Levothyroxine Sodium) .... Take 1 Tablet By Mouth Once A Day 5)  Metoprolol Succinate 50 Mg Xr24h-Tab (Metoprolol Succinate) .... Take 1 Tablet By Mouth Two Times A Day 6)  Pantoprazole Sodium 40 Mg Tbec (Pantoprazole Sodium) .... Take 1 Tablet By Mouth Once A Day 7)  Crestor 20 Mg Tabs (Rosuvastatin Calcium) .Marland Kitchen.. 1 At Bedtime 8)  Lisinopril 20 Mg Tabs (Lisinopril) .... Take 1 Tablet By Mouth Once A Day 9)  Currently Taking Antibiotic...pt Unsure of What Type  Allergies (verified): 1)  ! Codeine 2)  ! Hydrocodone  Review of Systems       NEGATIVE OTHER THAN HPI  Vital Signs:  Patient profile:   77 year old female Height:      62 inches Weight:      150.25 pounds BMI:     27.58 Pulse rate:   100 / minute Resp:     18 per minute BP sitting:   125 / 80  (left arm) Cuff size:   regular  Vitals Entered By: Celestia Khat, CMA (August 28, 2010 1:44 PM)  Physical Exam  General:  ELDERLY, FRAIL, NAD Head:  normocephalic and atraumatic Eyes:  PERRLA/EOM intact; conjunctiva and lids normal. Neck:  Neck supple, no JVD. No masses, thyromegaly or abnormal cervical nodes. Chest Noura Purpura:  no deformities or breast  masses noted Lungs:  Clear bilaterally to auscultation and percussion. Heart:  PMI NL, IRRR, VARIABLE S1 AND S2, NO BRUITS Msk:  decreased ROM.   Pulses:  pulses normal in all 4 extremities Extremities:  No clubbing or cyanosis. Neurologic:  Alert and oriented x 3. Skin:  Intact without lesions or rashes. Psych:  DISTANT, FLAT, A AND O X 3   EKG  Procedure date:  08/28/2010  Findings:      AFIB WITH VR OF 100  Impression & Recommendations:  Problem # 1:  ATRIAL FIBRILLATION (ICD-427.31)  WILL INCREASE DILTIAZEM TO DECREASE RATE. CONTINUE ASA. Her updated medication list for this problem includes:    Aspirin 81 Mg Tbec (Aspirin) .Marland Kitchen... Take one tablet by mouth daily    Metoprolol Succinate 50 Mg Xr24h-tab (Metoprolol succinate) .Marland Kitchen... Take 1 tablet by mouth two times a day  Orders: EKG w/ Interpretation (93000)  Problem # 2:  MITRAL VALVE PROLAPSE (ICD-424.0) NOT SEEN ON THIS PAST ECHO. Her updated medication list for this problem includes:    Metoprolol Succinate 50 Mg Xr24h-tab (Metoprolol succinate) .Marland Kitchen... Take 1 tablet by mouth two times a day    Lisinopril 20 Mg Tabs (Lisinopril) .Marland Kitchen... Take 1 tablet by mouth once a day  Problem # 3:  CEREBROVASCULAR ACCIDENT WITH RIGHT HEMIPARESIS (  ZOX-096.04) Assessment: Unchanged  Her updated medication list for this problem includes:    Aspirin 81 Mg Tbec (Aspirin) .Marland Kitchen... Take one tablet by mouth daily  Problem # 4:  HYPERTENSION (ICD-401.9)  Her updated medication list for this problem includes:    Aspirin 81 Mg Tbec (Aspirin) .Marland Kitchen... Take one tablet by mouth daily    Diltiazem Hcl Er Beads 240 Mg Xr24h-cap (Diltiazem hcl er beads) .Marland Kitchen... Take one capsule by mouth daily    Metoprolol Succinate 50 Mg Xr24h-tab (Metoprolol succinate) .Marland Kitchen... Take 1 tablet by mouth two times a day    Lisinopril 20 Mg Tabs (Lisinopril) .Marland Kitchen... Take 1 tablet by mouth once a day  Problem # 5:  HYPERLIPIDEMIA (ICD-272.4)  Her updated medication list for this  problem includes:    Crestor 20 Mg Tabs (Rosuvastatin calcium) .Marland Kitchen... 1 at bedtime  Patient Instructions: 1)  Your physician recommends that you schedule a follow-up appointment in: 3 months 11/11/10  2:15pm with Dr. Daleen Squibb 2)  Your physician has recommended you make the following change in your medication:  Prescriptions: PANTOPRAZOLE SODIUM 40 MG TBEC (PANTOPRAZOLE SODIUM) Take 1 tablet by mouth once a day  #30 x 3   Entered by:   Lisabeth Devoid RN   Authorized by:   Gaylord Shih, MD, [facility]   Signed by:   Lisabeth Devoid RN on 08/28/2010   Method used:   Electronically to        [facility] [address] [phone]* (retail)       [address]       [address]       [place], [place]  [address]       Ph: [phone]       Fax: [phone]   RxID:   [id number] METOPROLOL SUCCINATE 50 MG XR24H-TAB (METOPROLOL SUCCINATE) Take 1 tablet by mouth two times a day  #60 x 6   Entered by:   Lisabeth Devoid RN   Authorized by:   Gaylord Shih, MD, [facility]   Signed by:   Lisabeth Devoid RN on 08/28/2010   Method used:   Electronically to        [facility] [address] [phone]* (retail)       [address]       [address]       [place], [place]  [address]       Ph: [phone]       Fax: [phone]   RxID:   [id number] DILTIAZEM HCL ER BEADS 240 MG XR24H-CAP (DILTIAZEM HCL ER BEADS) Take one capsule by mouth daily  #30 x 6   Entered by:   Lisabeth Devoid RN   Authorized by:   Gaylord Shih, MD, [facility]   Signed by:   Lisabeth Devoid RN on 08/28/2010   Method used:   Electronically to        [facility] [address] [phone]* (retail)       [address]       [address]       [place], [place]  [address]       Ph: [phone]       Fax: [phone]   RxID:   [id number]

## 2010-09-22 NOTE — Letter (Signed)
Summary: [facility]   Imported By: Kassie Mends 09/16/2010 08:40:39  _____________________________________________________________________  External Attachment:    Type:   Image     Comment:   External Document

## 2010-10-28 ENCOUNTER — Other Ambulatory Visit (HOSPITAL_COMMUNITY): Payer: Self-pay | Admitting: Urology

## 2010-10-28 ENCOUNTER — Ambulatory Visit (HOSPITAL_COMMUNITY)
Admission: RE | Admit: 2010-10-28 | Discharge: 2010-10-28 | Disposition: A | Discharge status: Home / Self Care | Payer: Medicare Other | Source: Ambulatory Visit | Attending: Urology | Admitting: Urology

## 2010-10-28 DIAGNOSIS — Z85528 Personal history of other malignant neoplasm of kidney: Secondary | ICD-10-CM

## 2010-10-28 DIAGNOSIS — C649 Malignant neoplasm of unspecified kidney, except renal pelvis: Secondary | ICD-10-CM | POA: Insufficient documentation

## 2010-11-09 ENCOUNTER — Encounter: Payer: Self-pay | Admitting: Cardiology

## 2010-11-11 ENCOUNTER — Encounter: Payer: Self-pay | Admitting: Cardiology

## 2010-11-11 ENCOUNTER — Ambulatory Visit (INDEPENDENT_AMBULATORY_CARE_PROVIDER_SITE_OTHER): Payer: Medicare Other | Admitting: Cardiology

## 2010-11-11 VITALS — BP 129/74 | HR 57 | Resp 18 | Ht 62.0 in | Wt 147.1 lb

## 2010-11-11 DIAGNOSIS — I4891 Unspecified atrial fibrillation: Secondary | ICD-10-CM

## 2010-11-11 NOTE — Patient Instructions (Signed)
Your physician recommends that you schedule a follow-up appointment in: 1 year with Dr. Wall  

## 2010-11-11 NOTE — Assessment & Plan Note (Signed)
Stable, no change in treatment.

## 2010-11-11 NOTE — Progress Notes (Signed)
   Patient ID: [name], female    DOB: 11-16-1932, 77 y.o.   MRN: [id number]  Chest Pain  Associated symptoms include shortness of breath.  Shortness of Breath Associated symptoms include chest pain.   [name] returns for the E and M of her CAF. Other than occasional palpitations she is doing well. Dr Luretha Rued asked to take a fluid pill for BP and swelling but because she has one kidney she decided not to take. She is not on Coumadin because of right parietal bleed in 2008. She remains on ASA 81mg /day.  EKG shows CAF with well controlled rate.    Review of Systems  Respiratory: Positive for shortness of breath.   Cardiovascular: Positive for chest pain.  All other systems reviewed and are negative.      Physical Exam  Nursing note and vitals reviewed. Constitutional: She is oriented to person, place, and time. She appears well-developed and well-nourished. No distress.  HENT:  Head: Normocephalic and atraumatic.  Eyes: EOM are normal. Pupils are equal, round, and reactive to light.  Neck: Normal range of motion. No JVD present. No tracheal deviation present. No thyromegaly present.       Soft right bruit  Cardiovascular: Normal rate.  An irregularly irregular rhythm present.  No extrasystoles are present. PMI is not displaced.  Exam reveals decreased pulses.   Pulses:      Carotid pulses are on the right side with bruit. Pulmonary/Chest: Effort normal.  Abdominal: Soft. Bowel sounds are normal.  Musculoskeletal: Normal range of motion. She exhibits no edema.       Dependent rubor  Neurological: She is alert and oriented to person, place, and time.  Skin: Skin is warm and dry.  Psychiatric:       Flat affect, truncated answers

## 2010-11-19 ENCOUNTER — Telehealth: Payer: Self-pay | Admitting: Cardiology

## 2010-11-19 NOTE — Telephone Encounter (Signed)
Per pt calling, wants to discuss medication.

## 2010-11-19 NOTE — Telephone Encounter (Signed)
I spoke with pt and her pcp ,Dr. Juleen China,  increased her Toprol xl from 100mg  qd to 200 mg qd. Pt takes her toprol 50 mg bid.  Pt says she does not feel comfortable taking that much toprol. Her blood pressure right now is 128/67 hr 71.  She will recheck her blood pressure in the am before taking her toprol and give a call back to pcp.  Mylo Red RN

## 2010-11-24 NOTE — Op Note (Signed)
[name], [name] NO.:  [id number]   MEDICAL RECORD NO.:  [id number]          PATIENT TYPE:  AMB   LOCATION:  ENDO                         FACILITY:  [facility]   PHYSICIAN:  Georgiana Spinner, M.D.    DATE OF BIRTH:  Aug 10, 1932   DATE OF PROCEDURE:  DATE OF DISCHARGE:                               OPERATIVE REPORT   PROCEDURE:  Colonoscopy.   INDICATION:  Colon polyps.   ANESTHESIA:  Fentanyl 150 mcg, Versed 14 mg.   DESCRIPTION OF PROCEDURE:  With the patient mildly sedated in the left  lateral decubitus position, the Pentax videoscopic colonoscope was  inserted in the rectum, passed under direct vision through a  diverticular filled sigmoid colon and an erythematous descending and  distal transverse colon and to eventually reach the cecum with pressure  applied and the patient rolled to her back and subsequently to the right  side.  The cecum was identified by the ileocecal valve and appendiceal  orifice, both of which were photographed.  From this point, the  colonoscope was slowly withdrawn, taking circumferential views of the  colonic mucosa, stopping in the descending colon to biopsy the  erythematous changes along the way.  We then further withdrew all the  way to the rectum which appeared normal and the rectum showed  hemorrhoids on retroflexed view.  The endoscope was straightened and  withdrawn.  The patient's vital signs and pulse oximetry remained  stable.  The patient tolerated the procedure well with no apparent  complications.   FINDINGS:  Diverticulosis of the sigmoid colon, internal hemorrhoids and  what appears to be probably an ischemic colitis fairly mild.  Await  biopsy reports.  The patient will call me for results and follow up with  me as an outpatient.           ______________________________  Georgiana Spinner, M.D.     GMO/MEDQ  D:  11/24/2006  T:  11/24/2006  Job:  [id number]

## 2010-11-24 NOTE — Discharge Summary (Signed)
[name], [name]                 ACCOUNT NO.:  [id number]   MEDICAL RECORD NO.:  [id number]          PATIENT TYPE:  INP   LOCATION:  3041                         FACILITY:  [facility]   PHYSICIAN:  Pramod P. Pearlean Brownie, MD    DATE OF BIRTH:  03-Jun-1933   DATE OF ADMISSION:  08/18/2006  DATE OF DISCHARGE:  08/24/2006                               DISCHARGE SUMMARY   ADMISSION DIAGNOSES:  1. Headache.  2. Right brain hemorrhage.   DISCHARGE DIAGNOSES:  1. Transient headache, resolved.  2. Agitation, likely due to medication effect.  3. Hyperdensity on CT scan, thought initially to be hemorrhage, likely      a calcific area.   HOSPITAL COURSE:  [name] is a 77 year old lady who presented to  [facility] emergency room for the onset of right temporal headache.  CT  scan was done looking for hemorrhage showed an area of hyperdensity in  the right parietal white matter which was read as possible hemorrhage  and, hence, neurologic service was consulted.  The patient was admitted  for further workup and evaluation for the hemorrhage.  Her blood  pressure was found to be slightly elevated and was aggressively treated.  Subsequently, an MRI scan was obtained but was limited due to the  patient's agitation.  Only two sequences are obtained __________ .  No  evidence of active infarct was noted. __________ for follow up CT scan  was felt that the area of hyperdensity on the original CT scan was  probably a calcific area.  The patient remained on IV fluids  intermittently throughout the hospitalization, requiring some sedation  with Ativan and Haldol. __________  stroke work up included a 2D echo  which showed a normal ejection fraction but the aortic root was dilated  and, hence, CT angiogram of the chest was done to rule out a possible  aortic defection; however, none was found.  Cardiac ultrasound showed no  significant stenosis.  She was found to have tachyarrhythmia and it was  thought to be  atrial fibrillation.  Dr. Juanda Chance was consulted.  Dr.  Juanda Chance evaluated this and thought it was not atrial fibrillation but  paroxysmal supraventricular tachycardia, likely just __________  agitation.  Cardiac enzymes also were elevated but cardiology felt this  was not a myocardial infarction but secondary to agitation and muscle  movements.  She was started on an IV Cardizem drip for rate control and  then this was changed to p.o.  The patient's carotid ultrasound was  unremarkable.  Electrolytes were significant for low potassium which was  replaced.  A urinalysis revealed severe urinary tract infection for  which she was started on antibiotics.  The patient was receiving  physical and occupational therapy as well as rehabilitation and was felt  to be a candidate for rehab.  She was started on ciprofloxacin for her  urinary tract infection and continued on her home medication of  Synthroid 88 mcg daily, Zyrtec __________  mg daily, __________  50 mg a  day and Toprol XL 50 mg a day.  Initially Cardizem was  added.  She was  discharged to rehab in a stable condition and urged to follow up with  her family physician in a few weeks and with Dr. Rana Snare as needed.           ______________________________  Sunny Schlein. Pearlean Brownie, MD     PPS/MEDQ  D:  11/20/2006  T:  11/20/2006  Job:  [id number]

## 2010-11-27 NOTE — Op Note (Signed)
[place]  Patient:    [name], [name]                        MRN: [id number] Proc. Date: 06/08/00 Adm. Date:  [id number] Attending:  Michiel Sites                           Operative Report  PREOPERATIVE DIAGNOSES: 1. Left renal mass. 2. Hydronephrosis. 3. Gross hematuria.  POSTOPERATIVE DIAGNOSES: 1. Left renal mass. 2. Hydronephrosis. 3. Gross hematuria.  PROCEDURE:  Cystoscopy and left retrograde pyelogram with interpretation, with double J stent placement.  SURGEON:  Mark C. Vernie Ammons, M.D.  ANESTHESIA:  General endotracheal.  DRAINS:  26 cm, 6 French double J stent in the left ureter.  SPECIMENS:  None.  ESTIMATED BLOOD LOSS DURING PROCEDURE:  None.  COMPLICATIONS:  None.  INDICATIONS:  The patient is a 77 year old white female who was admitted earlier this week with acute-onset left flank pain, nausea, and vomiting.  She was felt to have a stone, but a CT scan revealed no stones.  It did, however, reveal left hydronephrosis and a mass in the left kidney most consistent with carcinoma.  She is brought to the OR today for further evaluation of the bladder, as her hematuria has persisted, and also to evaluate the ureter and kidney itself.  Because of persistent flank pain, nausea, and vomiting, and hydronephrosis, my feeling was that a possible stent placement may help her with these symptoms.  DESCRIPTION OF PROCEDURE:  After informed consent, patient brought to the major OR and placed on the table and administered general anesthesia and moved to the dorsal lithotomy position.  Genitalia were sterilely prepped and draped.  After performing this, I performed a GU examination, which reveals a normally-placed urethral meatus, no periurethral lesions or masses.  She has pretty good support of the anterior vaginal wall, no significant posterior vaginal wall abnormalities, and no masses palpated on bimanual exam.  The anus and  perineum appeared normal.  A 22 French cystoscope was then introduced in the bladder, and the bladder was fully inspected and there were a few clots that were flushed out.  The remainder of the bladder was noted to be free of any tumors, stones, or inflammatory lesions.  However, exiting the left ureteral orifice was a large clot.  An open-ended 6 French ureteral catheter was then placed in the left orifice, and a left retrograde pyelogram was performed under direct fluoroscopic control.  I could see what appeared to be clot in the distal ureter and mild to moderate hydronephrosis with some tortuosity of the ureter and what also appeared to be a possible narrowing of the UPJ region, but which may be secondary.  There was pyelocaliectasis, and large filling defects were noted to be free-floating within the renal pelvis, consistent with blood clots. There also appeared to be a mass effect at the midportion of the kidney with possible involvement of the calyx.  I cannot definitively say whether this represents transitional cell carcinoma or renal cell carcinoma with invasion into the collecting system.  Because of the presence of hydronephrosis and the clot at the ureteral orifice and continued bleeding, I passed a 0.038 inch floppy-tip guidewire into the area of the renal pelvis and then passed the open-ended stent up into the area of the renal pelvis.  I withdrew what appeared to be just grossly bloody fluid.  Because it was so bloody, I did not feel that cytology could be performed on this specimen.  I therefore replaced the guidewire, removed the open-ended ureteral catheter, and placed a double J stent, removing the guidewire with good curl being noted in the renal pelvis and bladder.  I then reinserted a 20 French Foley catheter into the bladder, connected this to closed-system drainage, and the patient was awakened and taken to the recovery room in stable and satisfactory  condition.  At this point, the exact nature of the renal mass has not been determined, and the two most likely possibilities would be transitional cell carcinoma versus renal cell carcinoma, which I think is more likely.  I therefore will schedule this patient for a radical left nephrectomy with frozen section and possible ureterectomy if it turns out to be transitional cell carcinoma. DD:  06/08/00 TD:  06/08/00 Job: [id number]

## 2010-11-27 NOTE — H&P (Signed)
[place]. [facility]  Patient:    [name], [name]                        MRN: [id number] Adm. Date:  [id number] Attending:  Michiel Sites CC:         Veverly Fells. Vernie Ammons, M.D.   History and Physical  DATE OF BIRTH:  April 26, 1933  CHIEF COMPLAINT:  "I have been having severe pain, and Im passing blood from my kidneys."  HISTORY OF PRESENT ILLNESS:  This 77 year old married white female was awakened about 3:30 a.m. this morning with severe left flank pain, and she noted gross hematuria.  She denied any fever, chills, sweats.  No previous history of bleeding that she is aware of, and no history of kidney stones. She is generally stable with her medical problems including hypertension, mitral valve prolapse, hiatal hernia with GERD, irritable bowel syndrome, hypothyroidism, and irregular heartbeat.  ALLERGIES:  CODEINE/HYDROCODONE.  She has INHALANT ALLERGIES and does take allergy shots weekly through Dr. Susy Manor office.  PAST SURGICAL HISTORY: 1. Tonsillectomy. 2. Sinus surgery. 3. Some type of gastric surgery for ulcer disease.  FAMILY HISTORY:  Both parents are deceased; mother about age 27, father about age 42; mother died of stroke, father died of possible heart failure.  She had seven brothers and three sisters; one brother died in his 39s of a car accident, another brother deceased of metastatic cancer with unknown primary. One of her sisters deceased about three or four months ago in her 36s of some type of liver disease induced and/or aggravated by various drugs, according to the patient.  There is specifically no family history of any tumors of the kidneys, bladder disorders.  There is some question of a kidney stone in her sister some years ago without recurrence.  SOCIAL HISTORY:  The patient works in the [employer] at [employer] and has been there for over 30 years.  She is concerned about her job at this point and states  that she gets no sick leave, will have to take a leave of absence with regard to current illness.  She is a nonsmoker, does not use any alcohol. Lives with her husband.  REVIEW OF SYSTEMS:  HEENT:  Denies any frequent headaches.  No history of seizures or frank syncope, but she does have lightheadedness when she has her tachyarrhythmias.  No problems with swallowing.  Denies any hearing difficulties.  No history of cataracts or glaucoma.  GASTROINTESTINAL:  She does have a hiatal hernia with reflux disease and has had endoscopy at least twice by Dr. Sabino Gasser of gastroenterology service.  She does have a history of irritable bowel symptoms but generally controls this with diet and has never been on any Metamucil.  She has had no hematemesis nor any melena. GENITOURINARY:  As per present illness.  RESPIRATORY:  Denies any wheezing or coughing.  She does get routine pulmonary function studies through her employer at [employer].  MUSCULOSKELETAL:  Denies any joint or muscle disorders.  Has not taken any type of NSAIDs or aspirin, as they generally irritate her stomach.  NEUROLOGICAL:  She is getting over a bout of shingles on her left lower leg and the top of her foot.  She did take a course of steroids and, presumably, an antiviral, Valtrex.  Her gastric problems were aggravated with the use of prednisone.  The lesions are just about dried up, she states.  GYNECOLOGIC:  Denies any female problems at all, no bleeding. She does get her Pap smears on an irregular basis and has not had a mammogram in recent years.  CARDIOVASCULAR:  She has a history of mitral valve prolapse, tachycardia, and is on Toprol.  She gets lightheaded when her heart rate is increased.  NEUROPSYCHIATRIC:  Denies any nervous disorders and no neuromuscular symptoms.  MEDICATIONS: 1. Tiazac 240 mg daily. 2. Toprol XL 50 mg daily. 3. Synthroid 0.088 mg daily. 4. Weekly allergy shots. 5. Prilosec 20 mg daily. 6.  Amitriptyline 25 mg daily.  PHYSICAL EXAMINATION:  VITAL SIGNS:  Pulse oximetry on room air is 96%.  Blood pressure initially recorded at 185/81, temperature 98.2 degrees orally, heart rate of 81, respirations of 20.  GENERAL:  Well-developed, well-nourished white female who appears her stated age.  She is edentulous.  She is alert and cooperative.  SKIN:  Warm and dry.  HEENT:  Pupils reactive to light and accommodation.  Extraocular muscles intact.  No signs of scleral icterus.  There is slight bilateral mild ptosis. The oral cavity shows her to be edentulous.  Membranes are pink and moist.  NECK:  Supple.  No thyromegaly or adenopathy, no jugular venous distention.  CHEST:  Normal configuration.  BREASTS:  Without masses or tenderness.  Nipples are everted.  Axillae free of masses.  Supraclavicular fossae are free of masses.  LUNGS:  Clear, without wheezes, rales, or rhonchi.  HEART:  No murmurs, gallops, or rubs.  ABDOMEN:  Slightly protuberant.  No masses, no tenderness can be appreciated. There is a Foley catheter in the urinary bladder draining some orange-yellow urine, presumably blood-tinged.  The bag is filled up to well over 1 L at this time.  PELVIC:  Deferred.  RECTAL:  Deferred.  EXTREMITIES:  No edema, cyanosis, or clubbing.  There are some healing vasculitic-type lesions on the dorsum of the left foot and some scabbed lesions from a recent herpes zoster on the left lateral calf on the distal one-third.  There is no evidence of infections about the feet.  Pulses are intact, moreso in the left foot than the right, but both feet are warm.  Hips show good range of motion.  NEUROLOGIC:  No focal deficits.  LABORATORY DATA:  CBC shows a white count of 9500, hemoglobin 13.2, hematocrit 37%, MCV of 93, platelet count 322,000.  Her comprehensive metabolic panel shows a sodium of 140, potassium 3.2, CO2 of 27, glucose of 145, BUN of 16, creatinine 0.8, calcium  9.4, total protein 7.1, albumin 4.1, AST/SGOT 50,  ALT/SGPT 29, alkaline phosphatase is 68, total bilirubin 0.4.  Her urine shows ______ urine, specific gravity 1.013, pH of 5.5, glucose is negative, hemoglobin is large, bilirubin is negative, total protein 30 mg percent, nitrate is negative, leukocyte esterase is negative.  A CT scan of the abdomen and pelvis notes a large left renal mass with two components on the upper and lower pole.  There is preservation of the renal vein.  There is some perinephric edema.  There is no evidence of regional adenopathy.  The ureter is dilated all the way down to the bladder.  The right kidney appears normal.  There is a small cyst in the liver which is ______  a density.  IMPRESSION: 1. Left flank pain and gross hematuria due to hemorrhage from a left renal    tumor, probably a renal cell carcinoma. 2. Mitral valve prolapse. 3. History of tachyarrhythmias. 4. Gastroesophageal reflux disease/hiatal  hernia symptoms. 5. Irritable bowel syndrome. 6. Labile hypertension.  PLAN:  The patient will be admitted to the service of Dr. Michiel Sites. I have contacted Dr. Loraine Leriche C. Ottelin of the urology service, who will see her this morning and make recommendations.  I have told the patient that she will likely need to have her kidney taken out and, therefore, will need a leave of absence since she has no sick leave with her job at [employer] for over 30 years. DD:  06/05/00 TD:  06/05/00 Job: [id number]

## 2010-11-27 NOTE — Op Note (Signed)
[place]. [facility]  Patient:    [name], [name]                        MRN: [id number] Proc. Date: 11/09/00 Adm. Date:  [id number] Disc. 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  POSTOPERATIVE DIAGNOSIS:  Left L4-5 foraminal stenosis with foot drop.  OPERATION PERFORMED:  Left L4-5 foraminotomy, microscope assisted.  SURGEON:  Mark C. Ophelia Charter, M.D.  ASSISTANT:  Colon Flattery. Ollen Bowl, M.D.  ANESTHESIA:  GOT plus Marcaine skin locally.  ESTIMATED BLOOD LOSS:  Less than 100 ml.  INDICATIONS FOR PROCEDURE:  The patient is a 77 year old female who had history of severe excruciating low back pain and left leg pain with foot drop. She had an associated rash at that time and was initially felt to have herpes zoster.  Persistent pain resolved over a period of six weeks and then she was left with the foot drop.  EMG changes showed denervation and reinnervation changes and some paraspinal changes consistent with central compression.  MRI showed severe left L4-5 foraminal stenosis without disk herniation.  DESCRIPTION OF PROCEDURE:  The patient was placed in the [facility] operative table after standard general anesthesia with preoperative Ancef prophylaxis. The back was prepped with DuraPrep.  The usual sterile towels were square off the area.  Betadine Vi-drape and laminectomy sheets and drapes.  Needle localization with spinal needle.  Cross table lateral x-ray was taken and small incision was made at the midline at L4-5.  Cobbs were used to identify the spinous process.  Ligaments were released off the bone, subperiosteal dissection, Taylor retractor placed laterally off the facet.  Laminotomy was performed removing at least one half the lamina on the left at L4.  There was significant facet overgrowth.  Bone was soft and medium Kerrison was used  for removal.  The nerve root was identified and as the nerve root started out the foramina, it was displaced ventrally with a large spur coming off the anterior aspect of the facet pushing the nerve root down.  A hockey stick would barely fit above the nerve root at this level at the mid to lateral aspect of the foramina.  Initially a small, then medium Kerrison was used to remove the bone spur.  Foraminotomy was performed with good decompression of the nerve root. Some hypertrophic ligament was removed from the axilla of the nerve root and from underneath the L5 lamina.  The top portion of the L5 lamina was removed as well completing the foramina and all sharp spikes of bone were gone.  Bone was removed laterally out to the level of the pedicle and with the DErrico, direct visualization of the dish showed that it was firm and no herniated fragments were present.  With complete decompression of the L5 nerve root obtained and all hypertrophic ligament removed centrally.  The operative field was irrigated with saline solution.  Fascia closed with 0 Vicryl, 2-0 Vicryl in the subcutaneous tissues and skin staple closure.  Operative microscope was used during the procedure for visualization of the nerve root.  Some small epidural veins just above the shoulder of the nerve root were coagulated with bipolar cautery with the dura carefully protected.  Instrument count and needle  count were correct.  2-0 Vicryl was placed in the subcutaneous tissues, skin staple closure, Marcaine in the skin.  Postoperative dressing, transferred to the recovery room in stable condition.  Patient ____________ foot drop and wore a brace chronically since the onset of her symptoms and patient is not awake enough for a neurologic exam at this time. DD:  11/09/00 TD:  11/10/00 Job: [id number]

## 2010-11-27 NOTE — Consult Note (Signed)
[place]. [facility]  Patient:    [name], [name]                        MRN: [id number] Proc. Date: 06/05/00 Adm. Date:  [id number] Attending:  Michiel Sites CC:         Janae Bridgeman. Eloise Harman., M.D.   Consultation Report  HISTORY OF PRESENT ILLNESS:  Patient is a 77 year old white female who works at [employer].  She told me that for five weeks, she has had some shingles involving one of her feet but had not had any flank pain.  On Thanksgiving day, she had nausea and vomiting but had no flank pain at that time, nor did she have any change in her urinary pattern or gross hematuria.  That resolved and then this morning, she was awakened by severe left flank pain.  This was preceded earlier in the evening by an episode of gross hematuria and followed by nausea and vomiting.  She has never had prior gross hematuria and denies any trauma to her flanks or back.  She has had a urinary tract infection in the very distant past and saw ______ for that, but has not had any recent difficulty.  She denies history of stones.  She does have mild stress urinary incontinence.  She otherwise has been doing well with no prior urologic difficulty other than as above.  PAST MEDICAL HISTORY:  Positive for mitral valve prolapse, tachyarrhythmias, gastroesophageal reflux disease, hypothyroidism, irritable bowel, peptic ulcer disease and has had surgery, many years ago, for that.  MEDICATIONS 1. Toprol XL 50 mg a day. 2. Prilosec 20 mg a day. 3. Synthroid 0.088 mg q.d.  ALLERGIES:  CODEINE.  SOCIAL HISTORY:  She denies tobacco or ethanol use.  FAMILY HISTORY:  Family history is negative for renal disease or GU malignancy.  PHYSICAL EXAMINATION  GENERAL:  The patient is a well-developed, well-nourished white female in no apparent distress.  VITAL SIGNS:  Blood pressure is 177/88, pulse 83, respirations 20, temperature 98.7.  HEENT:  Atraumatic, normocephalic.   She is edentulous and has no oral lesions.  NECK:  Supple without JVD or adenopathy.  LUNGS:  Clear to auscultation.  BREASTS:  No suspicious lumps or tenderness.  HEART:  Regular rate and rhythm without murmur or gallop.  ABDOMEN:  Soft and nontender.  No palpable mass noted, but there is some slight discomfort to percussion in the left flank region.  GU/PELVIC:  Normal external female genitalia.  Vaginal exam was deferred.  RECTAL:  Deferred.  EXTREMITIES:  Without clubbing, cyanosis, or edema.  She does have some healing herpes zoster lesions on her left leg and foot.  NEUROLOGIC:  There are no focal neurologic deficits.  LABORATORY AND X-RAY FINDINGS:  White count is 9.5, hemoglobin 13.2, hematocrit 37.3 with platelets at 322,000, no left shift.  Her electrolytes were normal with a sodium of 140, potassium 3.2, chloride 102, CO2 27, glucose 145.  BUN is 16, creatinine 0.8, calcium 9.4.  Her SGOT was slightly elevated at 50, normal SGPT at 29, alkaline phosphatase was normal at 68, as was her bilirubin.  Her urinalysis had no glucose, large hemoglobin, no ketones, and leukocyte esterase and nitrite negative.  A CT scan of the abdomen was obtained in the emergency room and this was reviewed.  It reveals a normal-appearing right kidney.  The left kidney has two hypervascular lesions consistent with renal cell carcinoma, but the  lesion is located in the inferior aspect, extending toward the hilar region and possibly into the collecting system.  There is left hydronephrosis and the ureter can be seen passing into the pelvis in a dilated state.  The renal vein appeared patent and there was no suspicious adenopathy seen.  IMPRESSION:  This unfortunate lady is a nonsmoker, making the likelihood that this lesion is a transitional cell carcinoma less; however, she does have some left hydronephrosis.  It may be due to a bleed into the lesion that then caused some bleeding into the  collecting system and some obstruction by clot, although I think evaluation of her left system with retrograde needs to be undertaken.  I have discussed with her and her husband the fact that regardless of whether this is a renal cell carcinoma or transitional cell carcinoma, she will need to undergo a left nephrectomy with curative intent. Whether she needs to undergo ureterectomy will be determined by further workup.  My feeling is that this is most likely a renal cell carcinoma.  PLAN 1. She is going to be admitted by Dr. Marcy Salvo C. Eloise Harman. and hydrated    and will attempt to control her pain. 2. She has got a Foley catheter in and I want to maintain that and hopefully,    her urine will clear. 3. She is going to need cystoscopy and a left retrograde, possibly even    ureteroscopy, based on my intraoperative findings, in preparation for    surgery and determination of whether she will need a ureterectomy in    combination with her nephrectomy. DD:  06/05/00 TD:  06/05/00 Job: [id number]

## 2010-11-27 NOTE — Discharge Summary (Signed)
NAMEFLOIS, [name]                 ACCOUNT NO.:  [id number]   MEDICAL RECORD NO.:  [id number]          PATIENT TYPE:  IPS   LOCATION:  4031                         FACILITY:  [facility]   PHYSICIAN:  Ranelle Oyster, M.D.DATE OF BIRTH:  [birth year]   DATE OF ADMISSION:  08/24/2006  DATE OF DISCHARGE:  09/02/2006                               DISCHARGE SUMMARY   DISCHARGE DIAGNOSES:  1. Right parietal intracerebral hemorrhage.  2. History of atrial fibrillation with tachyarrhythmia.  3. Hypothyroidism.  4. Gastroesophageal reflux disease.  5. Anxiety.  6. History of left nephrectomy, secondary to cancer.  7. E-coli urinary tract infection.   This is a 77 year old white female admitted 08/18/2006 with headache as  well as nausea, vomiting.  MRI showed a small right parietal  intracerebral hemorrhage.  Carotid Dopplers were negative for internal  carotid artery stenosis.  Echocardiogram with ejection fraction 60% and  normal left ventricular function.  Recurrent bouts of atrial  fibrillation.  Placed on Cardizem.  Followup per Cardiology Services.  CT of the chest was negative to rule out aortic root dissection.  She  had been placed on Cipro for an E-coli urinary tract infection.  Admitted for comprehensive rehab program.   PAST MEDICAL HISTORY:  See discharge diagnoses.  No alcohol or tobacco.   ALLERGIES:  1. HYDROCODONE.  2. CODEINE.   SOCIAL HISTORY:  Lives alone.  Son in the area.  Works.  Local friends  to check as needed.   MEDICATIONS PRIOR TO ADMISSION:  1. Toprol XL 50 mg daily.  2. Prilosec 20 mg daily.  3. Synthroid 88 mcg daily.  4. Xanax.   REHABILITATION HOSPITAL COURSE:  The patient was admitted to inpatient  rehab services with therapies initiated on a 3-hour daily basis  consisting of physical therapy, occupational therapy, speech therapy and  rehabilitation nursing.  The following issues were addressed during the  patient's rehabilitation stay.   Pertaining to Ms. Heffern's right  parietal intracerebral hemorrhage, remained stable.  She would follow up  with Neurology Services, Dr. Meryl Dare.  Overall, she was supervision for  her mobility, minimal assistance for stairs, supervision for upper body  activities of daily living and minimal assist lower body dressing.   Her diet was steadily advanced to regular, which she tolerated well.  Blood pressures, heart rate monitored with noted history of  tachyarrhythmia.  She continued on Cardizem 300 mg daily.  Blood  pressure with Toprol, which had been increased to 100 mg daily.  She  would remain on her Prilosec for history of gastroesophageal reflux  disease.  She completed a course of Cipro for an E-coli urinary tract  infection.  Noted history of lumbar disk surgery in the past with some  chronic back pain, as well as left lower extremity neuropathy.  She had  been placed on Lyrica 75 mg twice daily and monitored..  Overall, her  strength and endurance had greatly improved.  She was encouraged with  her overall progress and plans to be discharged home on 09/02/2006.   Latest labs showed a sodium 142, potassium 3.8,  BUN 18, creatinine 1.06.  Hemoglobin 13.1, hematocrit 38.5, platelet 225,000, WBC of 4.8.   DISCHARGE MEDICATIONS:  Discharge medications at the time of dictation  included:  1. Prilosec 20 mg daily.  2. Cardizem CD 300 mg daily.  3. Synthroid 88 mcg daily.  4. Toprol XL 100 mg daily.  5. Allegra 60 mg twice daily.  6. Lyrica 75 mg twice daily.  7. Xanax 0.25 mg every 8 hours as needed anxiety.   Diet was regular.  She would follow with Dr. Serita Kyle, Primary Care  Services, for medical management; Dr. Faith Rogue at the Outpatient  Rehab Service Office; Dr. Meryl Dare, Neurology Service; Dr. Lucas Mallow,  Cardiology services.      Mariam Dollar, P.A.      Ranelle Oyster, M.D.  Electronically Signed    DA/MEDQ  D:  09/01/2006  T:  09/01/2006  Job:  [id number]   cc:    Ranelle Oyster, M.D.  Anabel Halon. Lucas Mallow, M.D.  Dr. Meryl Dare

## 2010-11-27 NOTE — H&P (Signed)
[name], [name]                 ACCOUNT NO.:  [id number]   MEDICAL RECORD NO.:  [id number]          PATIENT TYPE:  IPS   LOCATION:  4031                         FACILITY:  [facility]   PHYSICIAN:  Ranelle Oyster, M.D.DATE OF BIRTH:  May 07, 1933   DATE OF ADMISSION:  08/24/2006  DATE OF DISCHARGE:                              HISTORY & PHYSICAL   CHIEF COMPLAINT:  Left sided weakness and lethargy.   HISTORY OF PRESENT ILLNESS:  This is a 77 year old white female with a  history of tachy arrhythmias and mitral valve prolapse who was admitted  on February 7 with headache, nausea and vomiting.  MRI revealed a right  sided small parietal intracranial hemorrhage.  Echocardiogram was  generally normal.  The patient was found to be having recurrent bouts of  atrial fibrillation and was paced on Cardizem.  The patient has  displayed waxing and waning mental status and arousal.  Sleep has been a  problem.  She was found to have an E. coli UTI for which she was placed  on Cipro.  The patient continues to struggle with mobility and self-care  and, thus, was brought to the rehab unit today to address these  functional and medical issues.   REVIEW OF SYSTEMS:  Positive for significant reflux, palpitations,  anxiety.  The patient has had some gas, as well.  Family and patient  note insomnia.  Full review is in the written H&P section of the chart.   PAST MEDICAL HISTORY:  Positive for:  1. Tachy arrhythmias.  2. Mitral valve prolapse.  3. Hypothyroidism.  4. Anxiety.  5. Nephrectomy secondary to cancer.  6. Gastroesophageal reflux disease.  7. Lumbar disc surgery in 2006.  8. Tonsillectomy.  9. The patient denies alcohol or tobacco.   FAMILY HISTORY:  Positive for stroke.   SOCIAL HISTORY:  The patient lives alone.  She has family and friends  who can apparently check in on her as needed.  Apparently, her husband  can help, as well, though she and her husband are separated.   FUNCTIONAL HISTORY:  The patient was independent prior to arrival.  Currently, she is at mod to max assist for basic mobility and self-care.   ALLERGIES:  HYDROCODONE AND CODEINE.   HOME MEDICATIONS:  Toprol XL 50 mg daily, Prilosec 20 mg daily,  Synthroid 0.088 mg daily, Xanax p.r.n. for anxiety.   LABORATORY:  Include hemoglobin 13, white count 5.3, platelets 108,000.  Sodium 141, potassium 3.4, BUN 17, creatinine 0.8.   PHYSICAL EXAMINATION:  VITAL SIGNS:  Blood pressure was 162/73, pulse is 70, respiratory rate  is 18, temperature 98.1.  GENERAL:  The patient is pleasant in no acute distress.  She is alert  and oriented to name.  She needed extra time and cues for place.  She  was unaware of date.  HEENT:  Unremarkable.  NECK:  Supple without JVD or lymphadenopathy.  HEART:  Regular rate and rhythm.  CHEST: Clear to auscultation bilaterally without wheezes, rales or  rhonchi.  ABDOMEN: Soft, nontender.  Bowel sounds are positive.  EXTREMITIES:  No  clubbing, cyanosis or edema.  SKIN:  Generally intact.  NEUROLOGICAL:  Cranial nerves 2-12 were notable for dysarthria.  She had  some difficulty with regular eye-opening and eye contact.  She was able  to track with the eyes fairly well, though was inconsistent.  Reflexes  were 1+ to 2+.  Sensation was generally intact.  The patient withdrew to  pain.  Judgment, orientation, memory, and mood were all variable, though  generally decreased.  Motor exam notable for on the right side 3/5  strength in deltoids, 4 biceps, 4- triceps, 4- fingers, 2 at the hips,  quads, tib anterior, and gastroc.  Left sided motor exam is notable for  1 at the deltoid, 2 biceps, 3 triceps, 3 finger flexors, and 2/5  inconsistently in the left leg.  The left shoulder is noted for adhesive  capsulitis and pain with any type of active or passive movement.   ASSESSMENT/PLAN:  1. Functional deficits secondary to right parietal intracranial      hemorrhage  with left sided weakness and waxing and waning cognitive      and arousal status.  The patient also has left sided degenerative      shoulder disease.  The patient will begin comprehensive inpatient      rehab with PT to assess and treat range of motion, strengthening,      transfers and gait.  OT will assess for upper extremity use, ADLs,      cognitive/perceptual training.  24-hour rehab nurse will follow for      bowel, bladder, skin, medication, and nutritional issues.  Speech      will follow for cognition, swallowing, and speech.  Rehab case      management/social worker will assess for psychosocial needs and      discharge planning.  Estimated length of stay is two plus weeks.      Goal is supervision to min assist.  Prognosis fair.  2. Tachy arrhythmias/atrial fib.  Continue Cardizem and Toprol.  3. Hypothyroid:  Synthroid.  4. Reflux disease:  Protonix.  5. Anxiety:  Monitor clinically.  6. Sleep-wake:  Check sleep-wake chart.  Reestablish cycle.  7. E. coli UTI:  Continue Cipro for now.  8. Dysphagia:  I will have speech therapy follow-up swallow at bedside      today.  Downgrade to D3 diet for now.  9. DVT prophylaxis:  Thigh high TED hose.      Ranelle Oyster, M.D.  Electronically Signed     ZTS/MEDQ  D:  08/24/2006  T:  08/24/2006  Job:  [id number]   cc:   Brooke Bonito, M.D.

## 2010-11-27 NOTE — Op Note (Signed)
[place]. [facility]  Patient:    [name], [name]                        MRN: [id number] Proc. Date: 06/10/00 Adm. Date:  [id number] Attending:  Trisha Mangle                           Operative Report  PREOPERATIVE DIAGNOSIS:  Left renal mass and hematuria.  POSTOPERATIVE DIAGNOSIS:  Left renal cell carcinoma.  PROCEDURE:  Left radical nephrectomy.  SURGEON:  Mark C. Vernie Ammons, M.D.  ASSISTANT:  Lucrezia Starch. Ovidio Hanger, M.D.  ANESTHESIA:  General.  SPECIMENS:  Left kidney to pathology.  ESTIMATED BLOOD LOSS:  Approximately 250 cc.  DRAINS:  20 French Foley catheter.  COMPLICATIONS:  None.  INDICATIONS:  Patient is a 77 year old white female who was admitted earlier this week with acute onset left flank pain and gross hematuria and was initially felt to have a stone, but a CT scan revealed some hydronephrosis and two solid left renal masses.  On Wednesday of this week, she underwent a left retrograde pyelogram, which revealed some hydronephrotic ureter but due to a clot in the distal ureter and clot in the renal pelvis, but I could not see any papillary lesions.  She is brought to the OR today for left radical nephrectomy and frozen section and if positive for transitional cell carcinoma, a left ureterectomy.  The risks and complications as well as limitations have been discussed with the patient, including but not limited to bleeding, infection, injury to surrounding organs such as the spleen, colon, great vessels, small bowel, pancreas, as well as anesthetic risks, DVT, PE, MI, etc.  Understanding and accepting these risks, the patient is consented for surgery.  DESCRIPTION OF PROCEDURE:  The patient was taken to the major OR and placed on the table and administered general anesthesia and then moved with the left flank up and a bump under her left side.  She had a Foley catheter indwelling, and this remained indwelling.  I then prepped the  entire abdomen and left flank.  Sterile drapes were applied, and I made a transverse subcostal incision, carried this down through skin and subcutaneous tissue to the fascia and muscle levels, which were divided with the Bovie electrocautery.  I then entered the peritoneal cavity and extended the peritoneotomy medially and laterally to the extent of the incision.  A Bookwalter retractor was then affixed to the table and retractors were placed to afford good exposure, and I incised along the white line of Toldt, reflecting the descending colon medially.  I then incised up superior to the kidney, incising the lienorenal ligament superior to the kidney, incising the lienorenal ligament and then developing the space between Gerotas fascia and the descending colonic mesentery, back down to the renal vein, which was easily identified.  I then dissected inferiorly using blunt and sharp technique and identified the ureter with its stent indwelling.  I ligated this with a silk suture distally and I hemoclipped proximally and divided it.  A long tag was left on the distal ureter for later identification, and the tissue superior to the kidney was then isolated with right angle clamps, clipped, and divided as I freed up the superior pole of the kidney.  The adrenal gland was identified.  It was partially transected and clipped.  Then I oversewed it with 2-0 chromic suture.  I then  dissected posteriorly and was able to palpate the single renal artery easily.  This was cleaned of surrounding tissue, and a right angle clamp was passed around this and a #1 silk was used to tie the single left renal artery proximally and distally, and then a second tie was placed proximally and it was divided.  I then ligated the renal vein in identical fashion.  Remaining lymphatic channels were clipped and divided, and the specimen was freed and sent to pathology for frozen section.  While awaiting frozen section, the  bowel was noted to be intact with no injury.  There was a very small tear of the splenic capsule. This was fulgurated, and then Surgicel was applied to this region and a dry laparotomy tape was placed in the left upper quadrant while we awaited frozen section results.  Dr. Francoise Schaumann from pathology called, stating the frozen section evaluation of the kidney revealed renal cell carcinoma, and therefore the distal ureterectomy was not indicated.  I therefore removed the distal segment of the cut double J stent from the ureter and placed a second hemoclip distally on the ureter.  I then checked the spleen, and no active bleeding was occurring.  No other bleeding was identified, so I therefore placed the descending colon back in its normal anatomic position, allowed the small bowel to fall in normal position, and then closed the incision with a #1 PDS, closing the internal and external oblique fascia separately and then irrigating the subcutaneous tissue with saline and closing the skin with skin staples.  The patient was awakened and taken to the recovery room in stable, satisfactory condition.  She tolerated the procedure well.  There were no intraoperative complications. Needle, sponge, and instrument counts were reportedly correct x 2 at the end of the operation. DD:  06/10/00 TD:  06/10/00 Job: [id number]

## 2010-11-27 NOTE — Assessment & Plan Note (Signed)
[name] is back regarding her right parietal intercerebral hemorrhage.  She  has been at home with her son who has now since gone back to work.  She  has been alone during the day.  She denies any problems.  She drove to  my office today without clearance from a doctor.  She saw Dr. Juleen [name] a  week ago who gave her a good medical report, apparently.  The patient  denies any problems with mood, memory, speech and sleep.  She is eating  well.  She has had no more problems with balance than beyond her  baseline.  She denies shortness of breath, chest pain, etc.  She has  come off Lyrica, she did not like how it made her feel.   SOCIAL HISTORY:  Pertinent positives listed above.  The patient does not  smoke or drink.   REVIEW OF SYSTEMS:  Full review is in the health and history section in  the chart.   PHYSICAL EXAMINATION:  VITAL SIGNS:  Blood pressure 146/65, pulse 54,  respiratory rate 16, she is sating 95% on room air.  The patient's  weight is stable.  GENERAL:  The patient is pleasant, alert and oriented x3.  Affect  bright, but a bit flat at times.  Gait is stable except for heels and  toes walking which cause some loss of balance.  HEART:  Regular.  CHEST:  Clear.  ABDOMEN:  Soft, nontender.  NEURO:  She had an intact cranial nerve exam.  She remembered 2/3 words  after 5 minutes.  She is able to spell the word world forward, but left  out l when spelling it backwards.  She was able to sequence 2/3 numbers  without error.  She did lack some higher level of cognitive focus.   ASSESSMENT:  1. Right parietal intercerebral hemorrhage.  2. History of atrial fibrillation.  3. Gastroesophageal reflux disease.  4. History of urinary tract infection.   PLAN:  1. Gave the patient permission to drive during the day local      distances.  2. Continue with current medications per family physician and      cardiologist.  3. I feel the patient is safe for independent activity within the  home      as well.  She has made a nice gain since being on rehab.  4. I will see her back in the future as needed.      [name], M.D.  Electronically Signed     ZTS/MedQ  D:  10/17/2006 12:23:46  T:  10/17/2006 12:44:31  Job #:  [id number]   cc:   [name], M.D.  Fax: [phone]

## 2010-11-27 NOTE — Discharge Summary (Signed)
[place]. [facility]  Patient:    [name], [name]                        MRN: [id number] Adm. Date:  [id number] Disc. Date: 06/14/00 Attending:  Trisha Mangle CC:         Bernadene Person, M.D.   Discharge Summary  PRINCIPAL DIAGNOSIS:  Renal cell carcinoma of the left kidney.  OTHER DIAGNOSES: 1. Urinary tract infection. 2. Anemia. 3. Mitral valve prolapse. 4. Tachyarrhythmias. 5. Gastroesophageal reflux disease. 6. Hypothyroidism.  MAJOR OPERATION:  Left radical nephrectomy.  OTHER OPERATIONS AND PROCEDURES:  Cystoscopy and left retrograde pyelogram with double-J stent placement.  DISPOSITION:  Patient is discharged home in a stable, satisfactory, and improved condition.  Her hemoglobin and hematocrit have remained stable.  She is afebrile, tolerating a regular diet, and her bowels are moving.  FOLLOW-UP:  In Dr. Billy Coast office in four days for skin staple removal.  ACTIVITY:  Limited to no heavy lifting, vigorous activity, driving, or up and down a great deal of stairs.  DIET:  Unlimited, other than no excessive protein.  DISCHARGE MEDICATIONS: 1. Iron sulfate 325 mg a.c. #45. 2. Demerol tablets 15 mg #40.  She will also remain on all of her prehospitalization medications.  BRIEF HISTORY:  Patient is a 77 year old white female who presented to the emergency room with nausea and vomiting on June 05, 2000.  She had had some nausea and vomiting earlier in the month, but no gross hematuria.  That resolved, and then she awoke the morning of her admission with severe left flank pain with nausea and vomiting, and then developed gross hematuria.  She was therefore admitted for pain control, further evaluation and treatment, and treatment of her nausea and vomiting.  Her full history and physical is previously dictated and will not be dictated here.  Upon admission, a CT scan was obtained, and this demonstrated two hypervascular left  renal masses, one appeared to be bilobar in shape coming off the upper pole of the left kidney, and was adjacent to what appeared to be a simple cyst.  There was also a 4 cm mass projecting from the lower medial aspect of the left kidney, impinging on the collecting system, and associated with hydronephrosis, although no stone could be seen in the ureter.  A simple hepatic cyst was also noted.  No adenopathy or renal vein involvement was noted.  The pelvic portion of the scan revealed dilatation of the distal ureter.  HOSPITAL COURSE:  The patient was admitted, hydrated, and a Foley catheter was placed.  She was placed on antiemetics.  She was found to have a BUN and creatinine of 16 and 0.8.  Her H&H was 13.2 and 37.3, respectively.  UA was positive for blood but was otherwise negative.  She remained somewhat nauseated with pain controlled with medication, and my hope was that her hematuria would clear to allow ureteroscopy.  However, continued hydration and diminished activity did not result in significant clearance of her urine.  She also had a temperature of 101.8 the evening of November 25, and a urine culture was obtained that eventually grew Enterobacter, which was sensitive to Cipro, which she was started on.  On June 08, 2000, she was taken to the operating room due to the persistent gross hematuria, and cystoscopy was performed.  The bladder was noted to be free of any lesions, and there was a clot noted at  the left ureteral orifice.  Retrograde pyelogram revealed hydronephrosis proximal to the clot, no filling defects until contrast filled the renal pelvis, where a large clot was noted again.  Because of the persistent hematuria, I placed a double-J stent in hopes that this would relieve obstruction and help her nausea and vomiting.  By the following day, her urine continued to remain bloody and I therefore discussed with her at length the need for left nephrectomy, with  frozen section evaluation and possible nephroureterectomy.  On June 10, 2000, she was taken to the operating room, where she underwent a left radical nephrectomy.  Frozen section at the time of surgery revealed renal cell carcinoma.  The following day, the patient continued to have good urine output.  Her potassium and sodium were low, and her IV fluid was changed to normal saline and potassium was added.  She had a slight fall in her hemoglobin, to a nadir of 8.3 on December 2, but on December 3 it was noted to be 8.8 with a hematocrit of 28.  Creatinine remained stable at 1.0, and she remained afebrile.  Her diet was progressed from clear liquids to regular.  It was well tolerated.  She was given some mild laxatives and had a bowel movement, and at this time is felt ready for discharge.  She continues to remain afebrile.  Her wound is healing nicely without any evidence of infection.  The final pathology report remains pending at this time. DD:  06/14/00 TD:  06/14/00 Job: [id number]

## 2010-11-27 NOTE — Op Note (Signed)
NAMEGWENDELYN, [name]                 ACCOUNT NO.:  [id number]   MEDICAL RECORD NO.:  [id number]          PATIENT TYPE:  AMB   LOCATION:  ENDO                         FACILITY:  [facility]   PHYSICIAN:  Georgiana Spinner, M.D.    DATE OF BIRTH:  [birth year]   DATE OF PROCEDURE:  05/21/2004  DATE OF DISCHARGE:                                 OPERATIVE REPORT   PROCEDURE PERFORMED:  Colonoscopy.   ENDOSCOPIST:  Georgiana Spinner, M.D.   INDICATIONS FOR PROCEDURE:  Hemoccult positivity, follow-up of polyp removed  six months ago.   ANESTHESIA:  Demerol 100 mg, Versed 7.5 mg.   DESCRIPTION OF PROCEDURE:  With the patient mildly sedated in the left  lateral decubitus position, the Olympus videoscopic colonoscope was inserted  in the rectum and passed under direct vision through tortuous diverticula  filled sigmoid colon to the cecum, identified by the ileocecal valve and  appendiceal orifice, both of which were photographed.  From this point the  colonoscope was slowly withdrawn taking circumferential views of the colonic  mucosa paying special attention to the ascending colon where the polyp had  been removed previously.  There was no sign of polyp and we did see scar  tissue, presumably from where we had removed the polyp previously.  This too  was photographed.  It was between two and three folds removed from the  ileocecal valve in the ascending colon.  From this point, the colonoscope  was then further withdrawn taking circumferential views of the colonic  mucosa stopping only in the rectum which appeared normal on direct and  retroflex view.  The endoscope was straightened and withdrawn.  The  patient's vital signs and pulse oximeter remained stable.  The patient  tolerated the procedure well without apparent complications.   FINDINGS:  No residual regrowth of polyp tissue in the ascending colon.   PLAN:  Have the patient follow up with me in two years or as needed.       GMO/MEDQ  D:   05/21/2004  T:  05/21/2004  Job:  [id number]

## 2010-11-27 NOTE — H&P (Signed)
NAMEJOSSALIN, [name]                 ACCOUNT NO.:  [id number]   MEDICAL RECORD NO.:  [id number]          PATIENT TYPE:  INP   LOCATION:  1828                         FACILITY:  [facility]   PHYSICIAN:  Genene Churn. Love, M.D.    DATE OF BIRTH:  11-02-1932   DATE OF ADMISSION:  08/18/2006  DATE OF DISCHARGE:                              HISTORY & PHYSICAL   This is one of several [facility] admissions for 77 year old  right-handed, white, married female from [place], [place] admitted  from the emergency room for evaluation of headache and abnormal CT brain  scan.   HISTORY OF PRESENT ILLNESS:  [name] has a history of  tachyarrhythmias, mitral valve prolapse, gastroesophageal reflux  disease, and hypothyroidism.  In 2001, she underwent surgery with left  nephrectomy for carcinoma of the kidney.  She has been in good health.,  she states most of her life, is independent in activities of daily  living.  She had a remote history of headaches many years ago and was  doing well until last p.m. about 10:00 p.m. after her church service  when she had the onset of right parietal headache.  This was associated  with nausea and vomiting.  The patient has no known history of dementia,  head trauma, alcoholism and denies known history of hypertension.  She  does have a history of cardiac arrhythmias, has been on Toprol.   PAST MEDICAL HISTORY:  1. Significant for nephrectomy for left renal cell carcinoma.  2. Mitral valve prolapse.  3. Gastroesophageal reflux disease.  4. Hypothyroidism.  5. Recurrent urinary tract infections.  6. She had left shoulder dislocation  in December, 200.  7. She had lower back surgery in about 2000.   SOCIAL HISTORY:  She does not smoke cigarettes.  She does not drink  alcohol.  She has no known drug allergies.  She went to the 10th grade  in school.  She is married and has 3 children, a sons 79 and 62 and a  daughter 84, living and well.   FAMILY  HISTORY:  Mother died at 55 from stroke.  Father died age 58 from  old age.  She has 7 brothers, 4 brothers alive and well.  She had  3sisters, 2 sisters are alive and well.   MEDICATIONS:  1. Synthroid 0.088 mg daily.  2. Toprol XL 50 mg daily.  3. Prilosec 20 mg daily.  4. Xanax dosage unknown once per day.   EXAMINATION:  GENERAL:  Well-developed white female.  VITAL SIGNS:  Blood pressure right arm 180/80 left arm 170/80, heart  rate was 64.  There were no bruits.  MENTAL STATUS:  She was alert, oriented x3.  Cranial nerve examination  with visual fields to be full.  Both disks were seen and flat.  The  extraocular movements were full.  Corneas were present.  She had  bilateral ptosis.  There is no facial asymmetry.  Hearing was decreased,  air conduction was greater than bone conduction.  Tongue was midline.  Uvula midline.  Gags were present.  MOTOR  EXAMINATION:  5/5 strength in the upper extremities.  She had hammertoe.  SENSORY EXAMINATION:  Was intact to pinprick touch and vibration, deep  tendon reflexes 1-2+.  She had left arm that was dislocated at the  shoulder and needed a sling.  GENERAL EXAMINATION:  Left tympanic membrane not seen, right tympanic  membrane clear.  Lungs were clear to auscultation examination. Heart  revealed murmurs.  She does of mitral valve prolapse.  Bowel sounds were  norma.  Breast examination was normal.   LABORATORY DATA:  Revealed white blood cell count 7,200, hemoglobin  13.1, hematocrit 38.7, platelet count 204 k.  Sed rate 15, SGPT 15.  Urinalysis negative.  Total protein 6.6, albumin 3.7, SGOT 21.  CT scan  shows small punctate hemorrhage in the right parietal region.  EKG  showed normal sinus rhythm.   IMPRESSION:  1. Headache, code 784.0.  2. Small right brain leak, code 431.  3. History of renal cancer, code 189.0.  4. Mitral valve prolapse, code 421.0.  5. History of hypothyroidism 244.9.  6. Low back surgery 724.4.  7.  History of cardiac arrhythmias on Toprol.  8. Anxiety code 300.00   PLAN:  At this time is to admit the patient obtain MRI study MRA and 2-D  echocardiogram.           ______________________________  Genene Churn. Sandria Manly, M.D.     JML/MEDQ  D:  08/18/2006  T:  08/18/2006  Job:  [id number]

## 2010-11-27 NOTE — H&P (Signed)
[facility]  Patient:    [name]                         MRN: [id number] Adm. Date:  [id number] Attending:  Clovis Cao Dictator:   Donzetta Matters, P.A.C.                         History and Physical  DATE OF BIRTH:  [birth year]  CHIEF COMPLAINT:  Chest pain.  HISTORY OF PRESENT ILLNESS:  This 77 year old female that had had chest pain at  rest today while at work.  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  Chest pain was relieved with nitroglycerin and oxygen after being brought to the emergency room by EMS.  She did have substernal chest pain, nonradiating. She had nausea but no vomiting, some shortness of breath, denies any diaphoresis.  ALLERGIES:  No known drug allergies.  MEDICATIONS: 1. Tiazac 240 mg daily. 2. Amitriptyline 25 mg daily. 3. Toprol 50 mg daily. 4. Zantac 150 mg b.i.d. 5. Synthroid 0.088 mg daily. 6. Weekly allergy shots.  PAST MEDICAL HISTORY:  Known for gastroesophageal reflux disease, irritable bowel syndrome, hypothyroidism, mitral valve prolapse, irregular heart rate, heart catheterization 10+ years ago by Dr. Clarene Duke with unknown results.  She did have a tonsillectomy, sinus surgery, stomach surgery for ulcers.  FAMILY HISTORY:  Mother with stroke.  Three sisters - heart disease, two brothers - congestive heart failure and irregular heart beat.  Father with congestive heart failure.  SOCIAL HISTORY:  Is a nonsmoker, denies any alcohol use, still is working.  REVIEW OF SYSTEMS:  She has had no fever, no chills.  Weight has remained stable, has had no swelling to her feet and ankles, has been sleeping well at night. Chest pain as described above.  Some mild dyspnea on exertion.  Denies any claudication. No cough, no wheezing, continues to be a nonsmoker.  Continues to have some episodes of belching, denies any heartburn, states her bowels have moved normally. Denies any  urinary problems, does usually get up one time at night.  Denies any  weakness, no bones, joints, or muscle myalgias.  Denies any feeling of passing ut, but states she has been dizzy, states her memory remains the same.  PHYSICAL EXAMINATION:  VITAL SIGNS:  Temperature is 97.1, pulse 96, respiratory rate 22, blood pressure 157/78, saturation 97% on room air.  GENERAL:  This is an anxious elderly female, at the present time on O2, appears in no acute distress.  She is well cared for, appears to give good history.  HEENT:  Pupils are equal, extraocular movements intact.  Mouth and pharynx benign with pink, moist mucosa.  Face is equal.  NECK:  Supple.  There is no JVD, no bruits, no thyromegaly, no adenopathy.  CHEST:  Clear with some scattered crackles at the base.  Breasts are normal adult female.  No cervical adenopathy is noted.  HEART:  Regular rate and rhythm, no obvious murmur.  ABDOMEN:  Soft with active bowel sounds, no abdominal bruits, no hepatosplenomegaly.  She is nontender over the bladder.  EXTREMITIES:  Full range of motion, good pulses at radius as well as dorsalis pedis.  SKIN:  No edema, full range of motion is noted to upper extremities.  She has no cyanosis or clubbing, no rashes.  NEUROLOGIC:  Grossly nonfocal.  Speech is clear.  There is no obvious tremor.  LABORATORY DATA:  EKG shows normal sinus rhythm.  Chest x-ray - No active disease.  CBC is normal.  CMET is normal.  PT is normal.  PTT is normal.  CK is 125, CK-MB is 2.3.  INR of 1.8.  IMPRESSION: 1. Chest pain. 2. Gastroesophageal reflux disease.  PLAN:  Repeat CK-MB, troponin I, repeat EKG.  Catheterization in the a.m. at 7:30 by Dr. Aleen Campi.  Place her on Prilosec while she is admitted to the hospital. DD:  06/22/99 TD:  06/22/99 Job: [id number]

## 2010-11-27 NOTE — Op Note (Signed)
NAME:  [name], [name]                           ACCOUNT NO.:  [id number]   MEDICAL RECORD NO.:  [id number]                   PATIENT TYPE:  AMB   LOCATION:  ENDO                                 FACILITY:  [facility]   PHYSICIAN:  Georgiana Spinner, M.D.                 DATE OF BIRTH:  1933/05/16   DATE OF PROCEDURE:  01/07/2004  DATE OF DISCHARGE:                                 OPERATIVE REPORT   PROCEDURE PERFORMED:  Colonoscopy with polypectomy and biopsy.   ENDOSCOPIST:  Georgiana Spinner, M.D.   INDICATIONS FOR PROCEDURE:  Colon polyp.   ANESTHESIA:  Demerol 100 mg, Versed 10 mg.   DESCRIPTION OF PROCEDURE:  With the patient mildly sedated in the left  lateral decubitus position, the Olympus video colonoscope was inserted in  the rectum and passed through a tortuous sigmoid colon that was diverticula  filled to the cecum, identified by the ileocecal valve and appendiceal  orifice, both of which were photographed.  From this point the colonoscope  was slowly withdrawn taking circumferential views of the colonic mucosa  stopping just a few folds removed from the ileocecal valve in the ascending  colon where a fairly large polyp was seen and photographed.  It was  certainly more than 1 cm in size.  It was surrounded and it was sessile but  we were able to ensnare this at the base and using snare cautery technique  with an Erbe argon photocoagulator pulse generator, we were able to remove  the polyp using a setting of 20/200 blended current.  There was some  residual polypoid tissue at the base and this was then removed using hot  biopsy forceps technique with the same setting.  There was good hemostasis  and total eradication of any polypoid tissue was noted by me.  Subsequently,  we could not find the polyp, the large portion that had been ensnared and  removed.  We advanced back to the cecum and slowly withdrew back and forth a  number of times in this area looking for the polyp.  Pullback  from this  point because it appeared that the polyp had gone forward in the colon  rather than retrograde.  We could not find it suctioning liquid stool as we  went though we came all the way back to the rectum, which appeared normal on  direct and retroflex view.  The endoscope was straightened and withdrawn.  The patient's vital signs and pulse oximeter remained stable.  The patient  tolerated the procedure well without apparent complications.   FINDINGS:  Large polyp of the ascending colon removed by snare cautery  technique and hot biopsy forceps technique.  Tissue was obtained for  pathology by hot biopsy forceps technique.  The large bulk of the polyp was  not however, retrieved despite our efforts to do so.   PLAN:  Await biopsy report.  Patient to call me for results and follow up  with me as outpatient and further follow-up will be dictated by the results  of the biopsy.     +                                               Georgiana Spinner, M.D.    GMO/MEDQ  D:  01/07/2004  T:  01/07/2004  Job:  [id number]

## 2010-11-27 NOTE — Op Note (Signed)
[name], [name]                 ACCOUNT NO.:  [id number]   MEDICAL RECORD NO.:  [id number]          PATIENT TYPE:  AMB   LOCATION:  ENDO                         FACILITY:  [facility]   PHYSICIAN:  Georgiana Spinner, M.D.    DATE OF BIRTH:  June 16, 1933   DATE OF PROCEDURE:  03/11/2005  DATE OF DISCHARGE:                                 OPERATIVE REPORT   PROCEDURE:  Upper endoscopy.   INDICATIONS:  Hemoccult positivity.   ANESTHESIA:  Demerol 40, Versed 6 mg.   PROCEDURE:  With the patient mildly sedated in the left lateral decubitus  position, the Olympus videoscopic endoscope was inserted and passed under  direct vision through the esophagus which appeared normal into the stomach.  Fundus and body were viewed and appeared erythematous consistent with a post  antrectomy state and duodenal tissue and small bowel tissue appeared normal.  From this point the endoscope was slowly withdrawn taking circumferential  views of duodenal mucosa until the endoscope had been pulled back in the  stomach and placed in retroflexion to view the stomach from below. The  endoscope was straightened, withdrawn taking circumferential views remaining  gastric and esophageal mucosa, stopping to biopsy the erythema seen in the  body of the stomach. The patient's vital signs pulse oximeter remained  stable. The patient tolerated procedure well without complications.   FINDINGS:  Erythema of the stomach biopsied, await biopsy report. The  patient will call me for results and follow-up with me as an outpatient.           ______________________________  Georgiana Spinner, M.D.     GMO/MEDQ  D:  03/11/2005  T:  03/11/2005  Job:  [id number]

## 2010-12-04 ENCOUNTER — Emergency Department (HOSPITAL_COMMUNITY): Payer: Medicare Other

## 2010-12-04 ENCOUNTER — Emergency Department (HOSPITAL_COMMUNITY)
Admission: EM | Admit: 2010-12-04 | Discharge: 2010-12-04 | Disposition: A | Discharge status: Home / Self Care | Payer: Medicare Other | Attending: Emergency Medicine | Admitting: Emergency Medicine

## 2010-12-04 DIAGNOSIS — I10 Essential (primary) hypertension: Secondary | ICD-10-CM | POA: Insufficient documentation

## 2010-12-04 DIAGNOSIS — K219 Gastro-esophageal reflux disease without esophagitis: Secondary | ICD-10-CM | POA: Insufficient documentation

## 2010-12-04 DIAGNOSIS — I059 Rheumatic mitral valve disease, unspecified: Secondary | ICD-10-CM | POA: Insufficient documentation

## 2010-12-04 DIAGNOSIS — I4891 Unspecified atrial fibrillation: Secondary | ICD-10-CM | POA: Insufficient documentation

## 2010-12-04 DIAGNOSIS — R079 Chest pain, unspecified: Secondary | ICD-10-CM | POA: Insufficient documentation

## 2010-12-04 DIAGNOSIS — E039 Hypothyroidism, unspecified: Secondary | ICD-10-CM | POA: Insufficient documentation

## 2010-12-04 DIAGNOSIS — E7800 Pure hypercholesterolemia, unspecified: Secondary | ICD-10-CM | POA: Insufficient documentation

## 2010-12-04 DIAGNOSIS — R0602 Shortness of breath: Secondary | ICD-10-CM | POA: Insufficient documentation

## 2010-12-04 LAB — BASIC METABOLIC PANEL
BUN: 17 mg/dL (ref 6–23)
Calcium: 9.2 mg/dL (ref 8.4–10.5)
Creatinine, Ser: 0.94 mg/dL (ref 0.4–1.2)
GFR calc non Af Amer: 58 mL/min — ABNORMAL LOW (ref >60)
Glucose, Bld: 92 mg/dL (ref 70–99)
Potassium: 4.9 mEq/L (ref 3.5–5.1)

## 2010-12-04 LAB — CK TOTAL AND CKMB (NOT AT ARMC)
CK, MB: 2.1 ng/mL (ref 0.3–4.0)
Relative Index: INVALID (ref 0.0–2.5)
Total CK: 77 U/L (ref 7–177)

## 2010-12-04 LAB — DIFFERENTIAL
Basophils Absolute: 0 10*3/uL (ref 0.0–0.1)
Eosinophils Absolute: 0.1 10*3/uL (ref 0.0–0.7)
Eosinophils Relative: 1 % (ref 0–5)
Lymphs Abs: 1.6 10*3/uL (ref 0.7–4.0)
Neutrophils Relative %: 68 % (ref 43–77)

## 2010-12-04 LAB — CBC
MCV: 92.3 fL (ref 78.0–100.0)
Platelets: 218 10*3/uL (ref 150–400)
RBC: 4.27 MIL/uL (ref 3.87–5.11)
RDW: 13.5 % (ref 11.5–15.5)
WBC: 7 10*3/uL (ref 4.0–10.5)

## 2010-12-04 LAB — TROPONIN I: Troponin I: <0.3 ng/mL (ref <0.30)

## 2010-12-09 ENCOUNTER — Ambulatory Visit (INDEPENDENT_AMBULATORY_CARE_PROVIDER_SITE_OTHER): Payer: Medicare Other | Admitting: Physician Assistant

## 2010-12-09 ENCOUNTER — Encounter: Payer: Self-pay | Admitting: Physician Assistant

## 2010-12-09 VITALS — BP 144/80 | HR 94 | Resp 18 | Ht 62.0 in | Wt 147.8 lb

## 2010-12-09 DIAGNOSIS — I359 Nonrheumatic aortic valve disorder, unspecified: Secondary | ICD-10-CM

## 2010-12-09 DIAGNOSIS — I4891 Unspecified atrial fibrillation: Secondary | ICD-10-CM

## 2010-12-09 DIAGNOSIS — I350 Nonrheumatic aortic (valve) stenosis: Secondary | ICD-10-CM

## 2010-12-09 DIAGNOSIS — R079 Chest pain, unspecified: Secondary | ICD-10-CM | POA: Insufficient documentation

## 2010-12-09 NOTE — Assessment & Plan Note (Signed)
Very atypical.  She still has a preserved S2.  I do not feel that her aortic stenosis is leading to her symptoms.  She apparently had a negative heart catheterization some 20 years ago.  I will set her up for a Lexiscan Myoview to rule out the possibility of ischemia.  Follow up with Dr. Daleen Squibb in 3-4 weeks as noted.

## 2010-12-09 NOTE — Patient Instructions (Signed)
Your physician recommends that you schedule a follow-up appointment in: 3-4 WEEKS WITH DR. WALL AS PER SCOTT WEAVER, PA-C  Your physician has requested that you have a lexiscan Myoview 786.50 CHEST PAIN. For further information please visit [email or web address] Please follow instruction sheet, as given.  Your physician has recommended that you wear a 48 HOUR  holter monitor 427.31 AFIB.  Holter monitors are medical devices that record the heart's electrical activity. Doctors most often use these monitors to diagnose arrhythmias. Arrhythmias are problems with the speed or rhythm of the heartbeat. The monitor is a small, portable device. You can wear one while you do your normal daily activities. This is usually used to diagnose what is causing palpitations/syncope (passing out).   Your physician has requested that you have an echocardiogram 424.1 VALVE DISORDER. Echocardiography is a painless test that uses sound waves to create images of your heart. It provides your doctor with information about the size and shape of your heart and how well your heart's chambers and valves are working. This procedure takes approximately one hour. There are no restrictions for this procedure.

## 2010-12-09 NOTE — Assessment & Plan Note (Signed)
Moderate to severe by echocardiogram 1/12.  In review of her chart, aortic stenosis has progressed since 2008 when she last had an echocardiogram.  Again I do not believe that her symptoms currently are explained by her aortic stenosis.  I will set her up for a repeat echocardiogram to make sure that she has not had progression of her aortic stenosis.  Follow up in 3-4 weeks with Dr. Daleen Squibb.

## 2010-12-09 NOTE — Assessment & Plan Note (Addendum)
Heart rate appears controlled on electrocardiogram today.  It also appeared controlled when she was seen the emergency room.  She had normal TSH in January.  Difficult to know if she's had permanent atrial fibrillation since January or if it actually predates January.  It is also difficult to know if most, if not all, of her symptoms are related to her atrial fibrillation.  There is a question of tachybradycardia syndrome in the chart.  I do not see clear documentation of this.  I will not change any of her medications today.  I will have her wear a 48 hour Holter monitor to assess heart rate control.  If her heart rate remains relatively uncontrolled, with could go up on her diltiazem which would also help with better blood pressure control.  See below.  Follow up with Dr. Daleen Squibb in 3-4 weeks. We could consider referral to electrophysiology if her workup is largely unremarkable and her symptoms seem to mainly be explained by AFib.

## 2010-12-09 NOTE — Progress Notes (Signed)
History of Present Illness: Primary Cardiologist:  Dr. Valera Castle  [name] is a 77 y.o. female with a history of permanent atrial fibrillation, not on Coumadin secondary to history of intracranial bleed and moderate to severe aortic stenosis who was recently seen the emergency room by Dr. Johney Frame on 5/25.  She was noted to be in atrial fibrillation with rapid ventricular rate at her PCPs office.  In the emergency room her heart rate was noted to be in the 90s on adequate doses of calcium channel blocker and beta blocker.  Cardiac enzymes were negative.  Creatinine was normal as was her potassium and hemoglobin and chest x-ray demonstrated no acute disease.  It was decided to hold off on adjusting of her medications as was felt that she was fairly stable and she was referred for follow up today.  She is a difficult historian.  She notes variations in her heart rate.  She states she has not been out of rhythm before until recently.  I reviewed her chart, especially her 1/12 admission.  She had AFib with RVR and her rate controlling medications were adjusted and she was deemed a poor candidate for coumadin due to her h/o intracranial bleed (off anticoagulation).  She has been kept on ASA only.  I explained this all to her today and why we are pursuing a rate control strategy.  She notes DOE.  She feels like it has been significant since January 2012.  No significant increase.  She has chest pain.  Cannot really describe.  Says "it just hurts."  Notes pain in left arm.  Can occur at rest and with exertion.  Somewhat vague about this.  Seems to get chest pain "if I walk far enough."  She sleeps on 2 pillows.  States she has periods of awakening from SOB (?PND).  She has some ankle edema.  She is concerned about blue discoloration of left ankle.  This appears to be dependent and related to venous insufficiency.   Past Medical History  Diagnosis Date  . Permanent atrial fibrillation   . Mitral valve  prolapse   . Sinus bradycardia-tachycardia syndrome   . Hemiplegia affecting unspecified side, late effect of cerebrovascular disease   . Hypertension   . Malignant neoplasm of kidney, except pelvis     s/p L nephrectomy  . Hyperlipidemia   . Osteoarthrosis, unspecified whether generalized or localized, unspecified site   . Peptic ulcer, unspecified site, unspecified as acute or chronic, without mention of hemorrhage, perforation, or obstruction   . Hypothyroidism   . Intracranial bleed     h/o right parietal bleed; not a coumadin candidate  . Aortic stenosis     a. echo 1/12: Ef 55-60%, mod LVH, grade 1 diast dysfxn, mod to severe AS with AVA 0.94 (Vmax) and mean gradient 23 mmHg, PASP 33    Current Outpatient Prescriptions  Medication Sig Dispense Refill  . amitriptyline (ELAVIL) 25 MG tablet Take 25 mg by mouth at bedtime.        Marland Kitchen aspirin 81 MG tablet Take 81 mg by mouth daily.        Marland Kitchen diltiazem (DILACOR XR) 240 MG 24 hr capsule Take 240 mg by mouth daily.        Marland Kitchen levothyroxine (SYNTHROID, LEVOTHROID) 88 MCG tablet Take 88 mcg by mouth daily.        Marland Kitchen lisinopril (PRINIVIL,ZESTRIL) 20 MG tablet Take 40 mg by mouth daily.       . metoprolol (  TOPROL-XL) 100 MG 24 hr tablet Take 100 mg by mouth 2 (two) times daily.        . pantoprazole (PROTONIX) 40 MG tablet Take 40 mg by mouth daily.        . rosuvastatin (CRESTOR) 20 MG tablet Take 20 mg by mouth daily.        Marland Kitchen DISCONTD: metoprolol (TOPROL-XL) 50 MG 24 hr tablet Take 50 mg by mouth 2 (two) times daily.          Allergies: Allergies  Allergen Reactions  . Codeine   . Hydrocodone    History  Substance Use Topics  . Smoking status: Never Smoker   . Smokeless tobacco: Not on file  . Alcohol Use: No    Vital Signs: BP 144/80  Pulse 94  Resp 18  Ht 5\' 2"  (1.575 m)  Wt 147 lb 12.8 oz (67.042 kg)  BMI 27.03 kg/m2  PHYSICAL EXAM: Well nourished, well developed, in no acute distress HEENT: normal Neck: no  JVD Endocrine: no thyromegaly Cardiac:  normal S1, S2; irreg irreg rhythm; 2/6 harsh systolic murmur along LSB Lungs:  clear to auscultation bilaterally, no wheezing, rhonchi or rales Abd: soft, nontender, no hepatomegaly Ext: trace bilateral edema; + varicosities and obvious blue discoloration of left ankle that almost resolves with elevation of extremity Skin: warm and dry Neuro:  CNs 2-12 intact, no focal abnormalities noted Psych: affect somewhat flat   EKG:  Atrial fibrillation, heart rate 94, normal axis, increased artifact, nonspecific ST-T wave changes  ASSESSMENT AND PLAN:

## 2010-12-21 NOTE — H&P (Signed)
NAMEEUGENIE, [name]                 ACCOUNT NO.:  [id number]  MEDICAL RECORD NO.:  [id number]           PATIENT TYPE:  E  LOCATION:  [facility]                         FACILITY:  [facility]  PHYSICIAN:  Hillis Range, MD       DATE OF BIRTH:  April 30, 1933  DATE OF ADMISSION:  12/04/2010 DATE OF DISCHARGE:  12/04/2010                             HISTORY & PHYSICAL   PRIMARY CARE PHYSICIAN:  Brooke Bonito, MD  PRIMARY CARDIOLOGIST:  Jesse Sans. Wall, MD, [facility]  CHIEF COMPLAINTS:  Rapid AFib.  HISTORY OF PRESENT ILLNESS:  [name] is a 77 year old female with a history of atrial fibrillation.  She reports sharp chest pain regularly that is brief and resolved spontaneously.  Her heart rate is "all over the place" when she takes her blood pressure, but her blood pressure is generally okay.  Occasionally, she will get dizzy and slightly presyncopal when her heart rate goes very-very high, but it has not done that recently.  She never has palpitations.  She has never had syncope. She has some dyspnea on exertion that has not changed recently.  She has been dealing with left foot paresthesias for greater than a month. Today, she had an appointment with her primary care physician for this and she was noted to have rapid AFib on vital sign check and EKG.  She was sent to the emergency room for evaluation.  Of note, her left foot has some areas of ecchymosis and it is slightly edematous, but has good distal pulses and capillary refill is within normal limits.  In the emergency room, she is resting comfortably.  PAST MEDICAL HISTORY: 1. Status post cardiac catheterization greater than 20 years ago which     by reports was okay. 2. Hypertension. 3. Hyperlipidemia. 4. Family history of AFib and coronary artery disease. 5. Permanent AFib. 6. Hypothyroidism. 7. Old right parietal hemorrhage in 2008. 8. Gastroesophageal reflux disease/IBS. 9. Status post echocardiogram, most recently in January 2012  showing     an EF of 55-60% with grade 1 diastolic dysfunction, moderate-to-     severe aortic stenosis, mean gradient 23 with peak gradient of 42,     mitral valve with moderately calcified annulus, but no significant     regurgitation, and prolapse not mentioned. 10.Degenerative joint disease.  SURGICAL HISTORY:  She is status post left nephrectomy secondary to renal cell carcinoma as well as EGD, back surgery, tonsillectomy, and sinus surgery.  ALLERGIES:  VICODIN and CODEINE.  CURRENT MEDICATIONS: 1. Toprol-XL 200 mg daily. 2 . Amitriptyline 25 mg nightly. 1. Crestor 20 mg daily. 2. Synthroid 88 mcg daily. 3. Lisinopril 40 mg daily. 4. Diltiazem ER 240 mg a day. 5. Aspirin 81 mg a day. 6. Protonix 40 mg a day.  SOCIAL HISTORY:  She lives in [place] alone, but with family close by. She is retired from [employer].  She denies alcohol, tobacco, or drug abuse.  FAMILY HISTORY:  Her mother died at 52 with a stroke and her father died at 25 with history of heart failure.  Of her siblings, at least one has AFib,  one has coronary artery disease, and one has a pacemaker.  REVIEW OF SYSTEMS:  She has ecchymosis to her left foot and some edema in that foot.  She has not had any recent presyncope.  She never gets palpitations.  The chest pain as described above.  The dyspnea on exertion does not seem to limit her activities significantly.  She feels the numbness and tingling in her left foot all the time and it is not altered by ambulation or elevation or any medication she has tried.  She also has some chronic arthralgias and joint pains.  She denies GI symptoms.  Full 14-point review of systems is otherwise negative except as stated in the HPI.  PHYSICAL EXAMINATION:  VITAL SIGNS:  Temperature is 98.2, blood pressure 134/99, pulse initially 101, now 85, O2 respiratory rate 16, O2 saturation 97% on room air. GENERAL:  She is a well-developed elderly white female in no  acute distress. HEENT:  Normal for age. NECK:  There is no lymphadenopathy, thyromegaly, bruit, or JVD noted. CV:  Her heart is irregular in rate and rhythm with an S1 and S2, and a systolic murmur is noted.  Distal pulses are intact in all 4 extremities, and capillary refill is within normal limits on both lower extremities.  Her lower left DP is possibly slightly weaker than the right DP, but 2+. LUNGS:  She has a few rales, but no crackles, wheezes, or rhonchi are noted. SKIN:  No rashes are noted.  There is an area of ecchymosis that is without tenderness on her left forefoot. ABDOMEN:  Soft and nontender with active bowel sounds. EXTREMITIES:  There is no cyanosis or clubbing noted.  There is only trace-to-1+ left pedal edema. MUSCULOSKELETAL:  There is no joint deformity or effusions. NEURO:  She is alert and oriented with cranial nerves II-XII grossly intact.  Chest x-ray, no acute disease.  EKG shows atrial fibrillation, rate 93, with no acute ischemic changes.  LABORATORY VALUES:  Hemoglobin 12.8, hematocrit 39.4, WBC 7.0, platelets 218.  Sodium 139, potassium 4.9, chloride 101, CO2 of 33, BUN 17, creatinine 0.94, glucose 92.  CK-MB and troponin I negative x1.  IMPRESSION:  [name] was seen today by Dr. Johney Frame, the patient evaluated and the data reviewed.  She has permanent atrial fibrillation and is now referred to the ER for management.  She is completely asymptomatic.  At rest, her heart rate is controlled and she is on significant doses of both the beta-blocker and the calcium channel blocker for control of tachycardia.  Her left foot appears to have an ecchymotic areas that is probably secondary to a traumatic injury, although the patient does not recall this.  Strength and sensation are intact.  She has good pulses and no evidence of vascular injury or embolism to the foot.  Due to prior intracranial hemorrhage, she is not a Coumadin candidate.  No inpatient  cardiovascular evaluation is planned at this time.  To avoid bradycardia (heart rate 57 on her last office visit), no medication changes are planned. Dr. Johney Frame discussed the situation with Dr. Ronne Binning who was on-call for Dr. Juleen China.  She is considered stable to discharge home and will follow up as an outpatient next week with Tereso Newcomer for Dr. Daleen Squibb on May 30 at 2:00 p.m.     Theodore Demark, PA-C   ______________________________ Hillis Range, MD    RB/MEDQ  D:  12/04/2010  T:  12/05/2010  Job:  [id number]  Electronically Signed by Bjorn Loser  BARRETT PA-C on 12/09/2010 11:32:30 AM Electronically Signed by Hillis Range MD on 12/21/2010 09:18:37 AM

## 2010-12-22 ENCOUNTER — Ambulatory Visit (HOSPITAL_BASED_OUTPATIENT_CLINIC_OR_DEPARTMENT_OTHER): Payer: Medicare Other | Admitting: Radiology

## 2010-12-22 ENCOUNTER — Encounter (INDEPENDENT_AMBULATORY_CARE_PROVIDER_SITE_OTHER): Payer: Medicare Other

## 2010-12-22 ENCOUNTER — Ambulatory Visit (HOSPITAL_COMMUNITY): Payer: Medicare Other | Attending: Cardiology

## 2010-12-22 VITALS — Ht 61.5 in | Wt 147.0 lb

## 2010-12-22 DIAGNOSIS — R0609 Other forms of dyspnea: Secondary | ICD-10-CM

## 2010-12-22 DIAGNOSIS — I4891 Unspecified atrial fibrillation: Secondary | ICD-10-CM

## 2010-12-22 DIAGNOSIS — I359 Nonrheumatic aortic valve disorder, unspecified: Secondary | ICD-10-CM

## 2010-12-22 DIAGNOSIS — I080 Rheumatic disorders of both mitral and aortic valves: Secondary | ICD-10-CM | POA: Insufficient documentation

## 2010-12-22 DIAGNOSIS — I350 Nonrheumatic aortic (valve) stenosis: Secondary | ICD-10-CM

## 2010-12-22 DIAGNOSIS — R0789 Other chest pain: Secondary | ICD-10-CM

## 2010-12-22 DIAGNOSIS — I079 Rheumatic tricuspid valve disease, unspecified: Secondary | ICD-10-CM | POA: Insufficient documentation

## 2010-12-22 DIAGNOSIS — R079 Chest pain, unspecified: Secondary | ICD-10-CM

## 2010-12-22 DIAGNOSIS — I379 Nonrheumatic pulmonary valve disorder, unspecified: Secondary | ICD-10-CM | POA: Insufficient documentation

## 2010-12-22 MED ORDER — TECHNETIUM TC 99M TETROFOSMIN IV KIT
33.0000 | PACK | Freq: Once | INTRAVENOUS | Status: AC | PRN
  Administered 2010-12-22: 33 via INTRAVENOUS

## 2010-12-22 MED ORDER — REGADENOSON 0.4 MG/5ML IV SOLN
0.4000 mg | Freq: Once | INTRAVENOUS | Status: AC
  Administered 2010-12-22: 0.4 mg via INTRAVENOUS

## 2010-12-22 MED ORDER — TECHNETIUM TC 99M TETROFOSMIN IV KIT
11.0000 | PACK | Freq: Once | INTRAVENOUS | Status: AC | PRN
  Administered 2010-12-22: 11 via INTRAVENOUS

## 2010-12-22 MED ORDER — TECHNETIUM TC 99M TETROFOSMIN IV KIT
10.6000 | PACK | Freq: Once | INTRAVENOUS | Status: AC | PRN
  Administered 2010-12-22: 11 via INTRAVENOUS

## 2010-12-22 NOTE — Progress Notes (Signed)
[facility] 3 NUCLEAR MED [address] [place] [address] [phone]  Cardiology Nuclear Med Study  [name] is a 77 y.o. female [id number] Jun 29, 1933   Nuclear Med Background Indication for Stress Test:  Evaluation for Ischemia and 12/04/10 Post Hospital: afib, CP, (-) enzymes History:  ~20 yrs ago Cath:OK per patient, 1/12 Echo:EF=55-60%, moderate-severe AS, MVP, afib. Cardiac Risk Factors: Family History - CAD, Hypertension and Lipids  Symptoms:  Chest Pressure with and without Exertion (none since discharge), Diaphoresis, Dizziness, DOE/SOB, Nausea, Near Syncope, Palpitations, Rapid HR, and Vomiting   Nuclear Pre-Procedure Caffeine/Decaff Intake:  None NPO After: 9:00pm   Lungs:  Clear.  O2 sat 93% on RA. IV 0.9% NS with Angio Cath:  20g  IV Site: R Antecubital  IV Started by:  Stanton Kidney, EMT-P  Chest Size (in):  36 Cup Size: B  Height: 5' 1.5" (1.562 m)  Weight:  147 lb (66.679 kg)  BMI:  Body mass index is 27.33 kg/(m^2). Tech Comments:  Toprol held this am, per patient.    Nuclear Med Study 1 or 2 day study: 1 day  Stress Test Type:  Lexiscan  Reading MD: Willa Rough, MD  Order Authorizing Provider:  Valera Castle, MD  Resting Radionuclide: Technetium 29m Tetrofosmin  Resting Radionuclide Dose: 10.6 mCi   Stress Radionuclide:  Technetium 70m Tetrofosmin  Stress Radionuclide Dose: 33 mCi           Stress Protocol Rest HR: 69 Stress HR: 97  Rest BP: 129/78 Stress BP: 130/82  Exercise Time (min): n/a METS: n/a   Predicted Max HR: 144 bpm % Max HR: 67.36 bpm Rate Pressure Product: 62952   Dose of Adenosine (mg):  n/a Dose of Lexiscan: 0.4 mg  Dose of Atropine (mg): n/a Dose of Dobutamine: n/a mcg/kg/min (at max HR)  Stress Test Technologist: Smiley Houseman, CMA-N  Nuclear Technologist:  Doyne Keel, CNMT     Rest Procedure:  Myocardial perfusion imaging was performed at rest 45 minutes following the intravenous administration of  Technetium 15m Tetrofosmin.  Rest ECG: Atrial Fibrillation, probable old SWMI.  Stress Procedure:  The patient received IV Lexiscan 0.4 mg over 15-seconds.  Technetium 51m Tetrofosmin injected at 30-seconds.  There were no significant changes with Lexiscan.  She did c/o chest pressure with infusion.  Quantitative spect images were obtained after a 45 minute delay.  Stress ECG: No significant change from baseline ECG  QPS Raw Data Images:  Normal; no motion artifact; normal heart/lung ratio. Stress Images:  Normal homogeneous uptake in all areas of the myocardium. Rest Images:  Normal homogeneous uptake in all areas of the myocardium. Subtraction (SDS):  No evidence of ischemia. Transient Ischemic Dilatation (Normal <1.22): 1.02 Lung/Heart Ratio (Normal <0.45):  .31  Quantitative Gated Spect Images QGS EDV:  34 ml QGS ESV:  12 ml QGS cine images:  NL LV Function; NL Wall Motion QGS EF: 64%  Impression Exercise Capacity:  Lexiscan with no exercise. BP Response:  Normal blood pressure response. Clinical Symptoms:  chest pressure ECG Impression:  No significant ST segment change suggestive of ischemia. Comparison with Prior Nuclear Study: No previous nuclear study performed  Overall Impression:  Normal stress nuclear study.   Willa Rough

## 2010-12-23 ENCOUNTER — Encounter: Payer: Self-pay | Admitting: Physician Assistant

## 2010-12-23 NOTE — Progress Notes (Signed)
COPY TO DR. WALL.Scarlette Ar

## 2011-01-05 ENCOUNTER — Other Ambulatory Visit: Payer: Self-pay | Admitting: Cardiology

## 2011-01-20 ENCOUNTER — Encounter: Payer: Self-pay | Admitting: *Deleted

## 2011-01-20 ENCOUNTER — Encounter: Payer: Self-pay | Admitting: Cardiology

## 2011-01-20 ENCOUNTER — Ambulatory Visit (INDEPENDENT_AMBULATORY_CARE_PROVIDER_SITE_OTHER): Payer: Medicare Other | Admitting: Cardiology

## 2011-01-20 DIAGNOSIS — I359 Nonrheumatic aortic valve disorder, unspecified: Secondary | ICD-10-CM

## 2011-01-20 DIAGNOSIS — I495 Sick sinus syndrome: Secondary | ICD-10-CM

## 2011-01-20 DIAGNOSIS — I059 Rheumatic mitral valve disease, unspecified: Secondary | ICD-10-CM

## 2011-01-20 DIAGNOSIS — I4891 Unspecified atrial fibrillation: Secondary | ICD-10-CM

## 2011-01-20 DIAGNOSIS — I350 Nonrheumatic aortic (valve) stenosis: Secondary | ICD-10-CM

## 2011-01-20 DIAGNOSIS — I69959 Hemiplegia and hemiparesis following unspecified cerebrovascular disease affecting unspecified side: Secondary | ICD-10-CM

## 2011-01-20 NOTE — Assessment & Plan Note (Signed)
Stable. Continue treatment with.

## 2011-01-20 NOTE — Progress Notes (Signed)
HPI [name] comes in today for followup of her chronic  A. Fib, tachybradycardia syndrome, moderate aortic stenosis,.  She has no complaints except that she wants to get her cataracts removed. She needs a letter of clearance.  She saw [place] reason for atypical chest pain. Stress Myoview showed no ischemia with ejection fraction 64%.  Holter monitor showed chronic A. Fib, average heart rate is 70 beats per minute, longus Colles at 2.3 seconds. No change in therapy was recommended. Echocardiogram on June 12 showed ejection fraction 65%, moderate aortic stenosis with a mean gradient of 28 mm of mercury, mild to moderate mitral regurgitation, no obvious prolapse, left atrial enlargement, poor heart pressure of 44 systolic. Study similar to that of January 2012.  She is not a Coumadin candidate because of a history of intracranial hemorrhage. Past Medical History  Diagnosis Date  . Permanent atrial fibrillation   . Mitral valve prolapse   . Sinus bradycardia-tachycardia syndrome   . Hemiplegia affecting unspecified side, late effect of cerebrovascular disease   . Hypertension   . Malignant neoplasm of kidney, except pelvis     s/p L nephrectomy  . Hyperlipidemia   . Osteoarthrosis, unspecified whether generalized or localized, unspecified site   . Peptic ulcer, unspecified site, unspecified as acute or chronic, without mention of hemorrhage, perforation, or obstruction   . Hypothyroidism   . Intracranial bleed     h/o right parietal bleed; not a coumadin candidate  . Aortic stenosis     a. echo 1/12: Ef 55-60%, mod LVH, grade 1 diast dysfxn, mod to severe AS with AVA 0.94 (Vmax) and mean gradient 23 mmHg, PASP 33;   b. echo 6/12: mod LVH, EF 65%, mod AS with mean gradient 28 mmHg, mild to mod MR, mild RAE, no change since 1/12  . Chest pain     Myoview 6/12: no ischemia, EF 64%    Past Surgical History  Procedure Date  . Tonsillectomy   . Back surgery     degenerative joint disease     Family History  Problem Relation Age of Onset  . Transient ischemic attack Mother   . Heart disease Father   . Arrhythmia Sister   . Arrhythmia Brother     History   Social History  . Marital Status: Widowed    Spouse Name: N/A    Number of Children: N/A  . Years of Education: N/A   Occupational History  . Not on file.   Social History Main Topics  . Smoking status: Never Smoker   . Smokeless tobacco: Not on file  . Alcohol Use: No  . Drug Use: No  . Sexually Active: Not on file   Other Topics Concern  . Not on file   Social History Narrative  . No narrative on file    Allergies  Allergen Reactions  . Codeine   . Hydrocodone     Current Outpatient Prescriptions  Medication Sig Dispense Refill  . amitriptyline (ELAVIL) 25 MG tablet Take 25 mg by mouth at bedtime.        Marland Kitchen aspirin 81 MG tablet Take 81 mg by mouth daily.        Marland Kitchen diltiazem (DILACOR XR) 240 MG 24 hr capsule Take 240 mg by mouth daily.        Marland Kitchen levothyroxine (SYNTHROID, LEVOTHROID) 88 MCG tablet Take 88 mcg by mouth daily.        Marland Kitchen lisinopril (PRINIVIL,ZESTRIL) 40 MG tablet Take 40 mg by mouth  daily.        . metoprolol (TOPROL-XL) 100 MG 24 hr tablet Take 100 mg by mouth 2 (two) times daily.        . pantoprazole (PROTONIX) 40 MG tablet TAKE 1 TABLET BY MOUTH ONCE A DAY  30 tablet  2  . rosuvastatin (CRESTOR) 20 MG tablet Take 20 mg by mouth daily.          ROS Negative other than HPI.   PE General Appearance: well developed, well nourished in no acute distress, obese HEENT: symmetrical face, PERRLA, good dentition  Neck: no JVD, thyromegaly, or adenopathy, trachea midline Chest: symmetric without deformity Cardiac: PMI non-displaced, RRR, normal S1, S2, systolic murmur consistent with aortic stenosis along the left sternal border, S2 splits Lung: clear to ausculation and percussion Vascular: all pulses full without bruits  Abdominal: nondistended, nontender, good bowel sounds, no HSM,  no bruits Extremities: no cyanosis, clubbing or edema, no sign of DVT, no varicosities  Skin: normal color, no rashes Neuro: alert and oriented x 3, non-focal Pysch: blunt direct affect  Filed Vitals:   01/20/11 1032  BP: 132/82  Pulse: 98  Resp: 14  Height: 5\' 1"  (1.549 m)  Weight: 147 lb (66.679 kg)    EKG  Labs and Studies Reviewed.   Lab Results  Component Value Date   WBC 7.0 12/04/2010   HGB 12.8 12/04/2010   HCT 39.4 12/04/2010   MCV 92.3 12/04/2010   PLT 218 12/04/2010      Chemistry      Component Value Date/Time   NA 139 12/04/2010 1328   K 4.9 12/04/2010 1328   CL 101 12/04/2010 1328   CO2 33* 12/04/2010 1328   BUN 17 12/04/2010 1328   CREATININE 0.94 12/04/2010 1328      Component Value Date/Time   CALCIUM 9.2 12/04/2010 1328   ALKPHOS 76 08/03/2010 1622   AST 22 08/03/2010 1622   ALT 11 08/03/2010 1622   BILITOT 0.5 08/03/2010 1622       Lab Results  Component Value Date   CHOL  Value: 175        ATP III CLASSIFICATION:  <200     mg/dL   Desirable  045-409  mg/dL   Borderline High  >=811    mg/dL   High        03/25/7828   Lab Results  Component Value Date   HDL 48 08/04/2010   Lab Results  Component Value Date   LDLCALC  Value: 103        Total Cholesterol/HDL:CHD Risk Coronary Heart Disease Risk Table                     Men   Women  1/2 Average Risk   3.4   3.3  Average Risk       5.0   4.4  2 X Average Risk   9.6   7.1  3 X Average Risk  23.4   11.0        Use the calculated Patient Ratio above and the CHD Risk Table to determine the patient's CHD Risk.        ATP III CLASSIFICATION (LDL):  <100     mg/dL   Optimal  562-130  mg/dL   Near or Above                    Optimal  130-159  mg/dL   Borderline  865-784  mg/dL  High  >190     mg/dL   Very High* 1/61/0960   Lab Results  Component Value Date   TRIG 118 08/04/2010   Lab Results  Component Value Date   CHOLHDL 3.6 08/04/2010   Lab Results  Component Value Date   HGBA1C  Value: 5.8 (NOTE)                                                                        According to the ADA Clinical Practice Recommendations for 2011, when HbA1c is used as a screening test:   >=6.5%   Diagnostic of Diabetes Mellitus           (if abnormal result  is confirmed)  5.7-6.4%   Increased risk of developing Diabetes Mellitus  References:Diagnosis and Classification of Diabetes Mellitus,Diabetes Care,2011,34(Suppl 1):S62-S69 and Standards of Medical Care in         Diabetes - 2011,Diabetes Care,2011,34  (Suppl 1):S11-S61.* 08/03/2010   Lab Results  Component Value Date   ALT 11 08/03/2010   AST 22 08/03/2010   ALKPHOS 76 08/03/2010   BILITOT 0.5 08/03/2010   Lab Results  Component Value Date   TSH 1.122 08/03/2010

## 2011-01-20 NOTE — Assessment & Plan Note (Signed)
Stable.  No change in therapy. 

## 2011-01-20 NOTE — Patient Instructions (Signed)
Your physician recommends that you schedule a follow-up appointment in: 1 year with Dr. Wall  

## 2011-03-22 ENCOUNTER — Telehealth: Payer: Self-pay | Admitting: Cardiology

## 2011-03-22 NOTE — Telephone Encounter (Signed)
Patient daughter calling, pt having upcoming surgery on 9/26. Need a clearance.

## 2011-03-22 NOTE — Telephone Encounter (Signed)
Daughter unsure if clearance is actually needed. Her mom states she is having cataract surgery but her mom is having demansia issues and is seeing pcp soon. Daughter will contact optometrist and will call back with further needs.Alfonso Ramus RN

## 2011-03-24 ENCOUNTER — Other Ambulatory Visit: Payer: Self-pay | Admitting: Cardiology

## 2011-03-26 ENCOUNTER — Emergency Department (HOSPITAL_COMMUNITY)
Admission: EM | Admit: 2011-03-26 | Discharge: 2011-03-27 | Disposition: A | Discharge status: Home / Self Care | Payer: Medicare Other | Attending: Emergency Medicine | Admitting: Emergency Medicine

## 2011-03-26 ENCOUNTER — Emergency Department (HOSPITAL_COMMUNITY): Payer: Medicare Other

## 2011-03-26 DIAGNOSIS — Y92009 Unspecified place in unspecified non-institutional (private) residence as the place of occurrence of the external cause: Secondary | ICD-10-CM | POA: Insufficient documentation

## 2011-03-26 DIAGNOSIS — I10 Essential (primary) hypertension: Secondary | ICD-10-CM | POA: Insufficient documentation

## 2011-03-26 DIAGNOSIS — Z79899 Other long term (current) drug therapy: Secondary | ICD-10-CM | POA: Insufficient documentation

## 2011-03-26 DIAGNOSIS — R110 Nausea: Secondary | ICD-10-CM | POA: Insufficient documentation

## 2011-03-26 DIAGNOSIS — W010XXA Fall on same level from slipping, tripping and stumbling without subsequent striking against object, initial encounter: Secondary | ICD-10-CM | POA: Insufficient documentation

## 2011-03-26 DIAGNOSIS — IMO0002 Reserved for concepts with insufficient information to code with codable children: Secondary | ICD-10-CM | POA: Insufficient documentation

## 2011-03-26 DIAGNOSIS — I499 Cardiac arrhythmia, unspecified: Secondary | ICD-10-CM | POA: Insufficient documentation

## 2011-03-26 DIAGNOSIS — F0781 Postconcussional syndrome: Secondary | ICD-10-CM | POA: Insufficient documentation

## 2011-03-26 DIAGNOSIS — I4891 Unspecified atrial fibrillation: Secondary | ICD-10-CM | POA: Insufficient documentation

## 2011-04-07 LAB — URINALYSIS, ROUTINE W REFLEX MICROSCOPIC
Leukocytes, UA: NEGATIVE
Nitrite: NEGATIVE
Protein, ur: 100 — AB
Urobilinogen, UA: 0.2

## 2011-04-07 LAB — POCT I-STAT, CHEM 8
HCT: 43
Hemoglobin: 14.6
Potassium: 4
Sodium: 135
TCO2: 31

## 2011-04-07 LAB — DIFFERENTIAL
Basophils Absolute: 0.1
Basophils Relative: 1
Eosinophils Absolute: 0
Monocytes Absolute: 0.2
Monocytes Relative: 2 — ABNORMAL LOW

## 2011-04-07 LAB — CBC
Hemoglobin: 13.9
MCHC: 33.6
MCV: 95.8
RBC: 4.32
RDW: 13.5

## 2011-04-07 LAB — HEPATIC FUNCTION PANEL
Bilirubin, Direct: 0.2
Indirect Bilirubin: 0.6
Total Bilirubin: 0.8

## 2011-04-07 LAB — URINE CULTURE

## 2011-04-07 LAB — URINE MICROSCOPIC-ADD ON

## 2011-04-07 LAB — LIPASE, BLOOD: Lipase: 19

## 2011-04-09 LAB — URINALYSIS, ROUTINE W REFLEX MICROSCOPIC
Nitrite: NEGATIVE
Specific Gravity, Urine: 1.017
Urobilinogen, UA: 0.2

## 2011-04-09 LAB — URINE CULTURE: Colony Count: >=100000

## 2011-04-09 LAB — LIPASE, BLOOD: Lipase: 25

## 2011-04-09 LAB — POCT CARDIAC MARKERS
CKMB, poc: 1.8
CKMB, poc: 2.4
Myoglobin, poc: 156
Operator id: 294501
Troponin i, poc: <0.05
Troponin i, poc: <0.05

## 2011-04-09 LAB — DIFFERENTIAL
Basophils Absolute: 0
Basophils Relative: 0
Eosinophils Absolute: 0
Eosinophils Relative: 0
Lymphocytes Relative: 7 — ABNORMAL LOW
Lymphs Abs: 0.5 — ABNORMAL LOW
Monocytes Absolute: 0.1
Monocytes Relative: 2 — ABNORMAL LOW
Neutro Abs: 6.2
Neutrophils Relative %: 91 — ABNORMAL HIGH

## 2011-04-09 LAB — CBC
HCT: 38.8
Hemoglobin: 13.1
MCHC: 34
MCV: 95.2
Platelets: 225
RBC: 4.07
RDW: 13.4
WBC: 6.8

## 2011-04-09 LAB — HEPATIC FUNCTION PANEL
ALT: 12
AST: 23
Albumin: 4
Alkaline Phosphatase: 82
Bilirubin, Direct: 0.3
Indirect Bilirubin: 0.4
Total Bilirubin: 0.7
Total Protein: 7

## 2011-04-09 LAB — OCCULT BLOOD X 1 CARD TO LAB, STOOL: Fecal Occult Bld: NEGATIVE

## 2011-04-09 LAB — POCT I-STAT, CHEM 8
Chloride: 99
HCT: 41
Potassium: 4.2

## 2011-04-09 LAB — URINE MICROSCOPIC-ADD ON

## 2011-05-12 ENCOUNTER — Other Ambulatory Visit: Payer: Self-pay | Admitting: Cardiology

## 2011-05-15 ENCOUNTER — Inpatient Hospital Stay (HOSPITAL_COMMUNITY)
Admission: EM | Admit: 2011-05-15 | Discharge: 2011-05-18 | DRG: 391 | Disposition: A | Discharge status: Home Health | Payer: Medicare Other | Attending: Internal Medicine | Admitting: Internal Medicine

## 2011-05-15 ENCOUNTER — Emergency Department (HOSPITAL_COMMUNITY): Payer: Medicare Other

## 2011-05-15 DIAGNOSIS — I69959 Hemiplegia and hemiparesis following unspecified cerebrovascular disease affecting unspecified side: Secondary | ICD-10-CM | POA: Diagnosis present

## 2011-05-15 DIAGNOSIS — R112 Nausea with vomiting, unspecified: Secondary | ICD-10-CM | POA: Diagnosis present

## 2011-05-15 DIAGNOSIS — Z8553 Personal history of malignant neoplasm of renal pelvis: Secondary | ICD-10-CM

## 2011-05-15 DIAGNOSIS — K279 Peptic ulcer, site unspecified, unspecified as acute or chronic, without hemorrhage or perforation: Secondary | ICD-10-CM | POA: Diagnosis present

## 2011-05-15 DIAGNOSIS — Z905 Acquired absence of kidney: Secondary | ICD-10-CM

## 2011-05-15 DIAGNOSIS — K219 Gastro-esophageal reflux disease without esophagitis: Principal | ICD-10-CM | POA: Diagnosis present

## 2011-05-15 DIAGNOSIS — Z7982 Long term (current) use of aspirin: Secondary | ICD-10-CM

## 2011-05-15 DIAGNOSIS — E039 Hypothyroidism, unspecified: Secondary | ICD-10-CM | POA: Diagnosis present

## 2011-05-15 DIAGNOSIS — I10 Essential (primary) hypertension: Secondary | ICD-10-CM | POA: Diagnosis present

## 2011-05-15 DIAGNOSIS — Z79899 Other long term (current) drug therapy: Secondary | ICD-10-CM

## 2011-05-15 DIAGNOSIS — E785 Hyperlipidemia, unspecified: Secondary | ICD-10-CM | POA: Diagnosis present

## 2011-05-15 DIAGNOSIS — M1990 Unspecified osteoarthritis, unspecified site: Secondary | ICD-10-CM | POA: Diagnosis present

## 2011-05-15 DIAGNOSIS — J189 Pneumonia, unspecified organism: Secondary | ICD-10-CM | POA: Diagnosis present

## 2011-05-15 DIAGNOSIS — K589 Irritable bowel syndrome without diarrhea: Secondary | ICD-10-CM | POA: Diagnosis present

## 2011-05-15 DIAGNOSIS — I359 Nonrheumatic aortic valve disorder, unspecified: Secondary | ICD-10-CM | POA: Diagnosis present

## 2011-05-15 DIAGNOSIS — R0789 Other chest pain: Secondary | ICD-10-CM | POA: Diagnosis present

## 2011-05-15 DIAGNOSIS — I350 Nonrheumatic aortic (valve) stenosis: Secondary | ICD-10-CM | POA: Diagnosis present

## 2011-05-15 DIAGNOSIS — I4891 Unspecified atrial fibrillation: Secondary | ICD-10-CM | POA: Diagnosis present

## 2011-05-15 LAB — LIPID PANEL: Total CHOL/HDL Ratio: 2.5 RATIO

## 2011-05-15 LAB — CBC
Hemoglobin: 12.2 g/dL (ref 12.0–15.0)
MCHC: 31.4 g/dL (ref 30.0–36.0)
RDW: 14.3 % (ref 11.5–15.5)
WBC: 8.5 10*3/uL (ref 4.0–10.5)

## 2011-05-15 LAB — POCT I-STAT, CHEM 8
Chloride: 101 mEq/L (ref 96–112)
HCT: 41 % (ref 36.0–46.0)
Hemoglobin: 13.9 g/dL (ref 12.0–15.0)
Potassium: 4.2 mEq/L (ref 3.5–5.1)
Sodium: 139 mEq/L (ref 135–145)

## 2011-05-15 LAB — DIFFERENTIAL
Basophils Absolute: 0 10*3/uL (ref 0.0–0.1)
Basophils Relative: 0 % (ref 0–1)
Eosinophils Relative: 0 % (ref 0–5)
Monocytes Absolute: 0.4 10*3/uL (ref 0.1–1.0)
Neutro Abs: 7.5 10*3/uL (ref 1.7–7.7)

## 2011-05-15 LAB — URINALYSIS, ROUTINE W REFLEX MICROSCOPIC
Glucose, UA: NEGATIVE mg/dL
Ketones, ur: NEGATIVE mg/dL
Leukocytes, UA: NEGATIVE
Nitrite: NEGATIVE
Protein, ur: 100 mg/dL — AB

## 2011-05-15 LAB — POCT I-STAT TROPONIN I: Troponin i, poc: 0 ng/mL (ref 0.00–0.08)

## 2011-05-15 LAB — CARDIAC PANEL(CRET KIN+CKTOT+MB+TROPI)
Relative Index: INVALID (ref 0.0–2.5)
Relative Index: INVALID (ref 0.0–2.5)
Total CK: 72 U/L (ref 7–177)
Troponin I: <0.3 ng/mL (ref <0.30)

## 2011-05-15 LAB — URINE MICROSCOPIC-ADD ON

## 2011-05-15 LAB — CK TOTAL AND CKMB (NOT AT ARMC): Relative Index: INVALID (ref 0.0–2.5)

## 2011-05-15 MED ORDER — ASPIRIN EC 81 MG PO TBEC
81.0000 mg | DELAYED_RELEASE_TABLET | Freq: Every day | ORAL | Status: DC
  Filled 2011-05-15 (×2): qty 1

## 2011-05-15 MED ORDER — MIRTAZAPINE 7.5 MG PO TABS
7.5000 mg | ORAL_TABLET | Freq: Every day | ORAL | Status: DC
  Administered 2011-05-17: 7.5 mg via ORAL
  Filled 2011-05-15 (×3): qty 0.5
  Filled 2011-05-15: qty 1

## 2011-05-15 MED ORDER — PROMETHAZINE HCL 12.5 MG PO TABS
6.2500 mg | ORAL_TABLET | Freq: Four times a day (QID) | ORAL | Status: DC | PRN
  Filled 2011-05-15: qty 1

## 2011-05-15 MED ORDER — ACETAMINOPHEN 325 MG PO TABS
650.0000 mg | ORAL_TABLET | ORAL | Status: DC | PRN
  Administered 2011-05-16 – 2011-05-18 (×3): 650 mg via ORAL
  Filled 2011-05-15: qty 2

## 2011-05-15 MED ORDER — DILTIAZEM HCL ER COATED BEADS 240 MG PO CP24
240.0000 mg | ORAL_CAPSULE | Freq: Every day | ORAL | Status: DC
  Administered 2011-05-17 – 2011-05-18 (×2): 240 mg via ORAL
  Filled 2011-05-15 (×4): qty 1

## 2011-05-15 MED ORDER — PANTOPRAZOLE SODIUM 40 MG IV SOLR
40.0000 mg | Freq: Every day | INTRAVENOUS | Status: DC
  Filled 2011-05-15 (×2): qty 40

## 2011-05-15 MED ORDER — METOPROLOL SUCCINATE ER 100 MG PO TB24
100.0000 mg | ORAL_TABLET | Freq: Two times a day (BID) | ORAL | Status: DC
  Filled 2011-05-15 (×3): qty 1

## 2011-05-15 MED ORDER — ASPIRIN 81 MG PO CHEW
81.0000 mg | CHEWABLE_TABLET | Freq: Every day | ORAL | Status: DC
  Administered 2011-05-17 – 2011-05-18 (×2): 81 mg via ORAL

## 2011-05-15 MED ORDER — MORPHINE SULFATE 2 MG/ML IJ SOLN: 2.0000 mg | INTRAMUSCULAR | Status: DC | PRN

## 2011-05-15 MED ORDER — ROSUVASTATIN CALCIUM 40 MG PO TABS
40.0000 mg | ORAL_TABLET | Freq: Every day | ORAL | Status: DC
  Filled 2011-05-15 (×2): qty 1

## 2011-05-15 MED ORDER — ONDANSETRON HCL 4 MG/2ML IJ SOLN
4.0000 mg | Freq: Four times a day (QID) | INTRAMUSCULAR | Status: DC | PRN
  Administered 2011-05-16 – 2011-05-17 (×2): 4 mg via INTRAVENOUS

## 2011-05-15 MED ORDER — LISINOPRIL 40 MG PO TABS
40.0000 mg | ORAL_TABLET | Freq: Every day | ORAL | Status: DC
  Administered 2011-05-17 – 2011-05-18 (×2): 40 mg via ORAL
  Filled 2011-05-15 (×4): qty 1

## 2011-05-15 MED ORDER — SODIUM CHLORIDE 0.9 % IV SOLN: INTRAVENOUS | Status: DC

## 2011-05-15 MED ORDER — LEVOFLOXACIN IN D5W 750 MG/150ML IV SOLN
750.0000 mg | INTRAVENOUS | Status: DC
  Administered 2011-05-16 – 2011-05-17 (×2): 750 mg via INTRAVENOUS
  Filled 2011-05-15 (×2): qty 150

## 2011-05-15 MED ORDER — ALUM & MAG HYDROXIDE-SIMETH 400-400-40 MG/5ML PO SUSP
30.0000 mL | Freq: Four times a day (QID) | ORAL | Status: DC | PRN
  Filled 2011-05-15: qty 30

## 2011-05-15 MED ORDER — LEVOTHYROXINE SODIUM 88 MCG PO TABS
88.0000 ug | ORAL_TABLET | Freq: Every day | ORAL | Status: DC
  Administered 2011-05-16 – 2011-05-18 (×2): 88 ug via ORAL
  Filled 2011-05-15 (×4): qty 1

## 2011-05-15 MED ORDER — ONDANSETRON HCL 4 MG PO TABS: 4.0000 mg | ORAL_TABLET | Freq: Four times a day (QID) | ORAL | Status: DC | PRN

## 2011-05-15 MED ORDER — LORAZEPAM 2 MG/ML IJ SOLN
0.5000 mg | Freq: Four times a day (QID) | INTRAMUSCULAR | Status: DC | PRN
  Administered 2011-05-16 – 2011-05-17 (×3): 0.5 mg via INTRAVENOUS
  Filled 2011-05-15: qty 1

## 2011-05-15 MED ORDER — TRAMADOL HCL 50 MG PO TABS: 50.0000 mg | ORAL_TABLET | Freq: Four times a day (QID) | ORAL | Status: DC | PRN

## 2011-05-15 NOTE — H&P (Signed)
[name], [name] NO.:  [id number]  MEDICAL RECORD NO.:  [id number]  LOCATION:  [facility]                         FACILITY:  [facility]  PHYSICIAN:  Brendia Sacks, MD    DATE OF BIRTH:  1933/03/24  DATE OF ADMISSION:  05/15/2011 DATE OF DISCHARGE:                             HISTORY & PHYSICAL   PRIMARY CARE PHYSICIAN:  Dr. Juleen China.  PRIMARY CARDIOLOGIST:  Jesse Sans. Wall, MD, [facility]  Note that this patient appears to have 4 different medical record numbers.  CHIEF COMPLAINT:  Vomiting.  HISTORY OF PRESENT ILLNESS:  This is a 77 year old woman who presents with abrupt onset of nausea and vomiting today.  The patient was in her usual state of health until she was awoken this morning at about 5 a.m. with nausea and vomiting.  She had many episodes of vomiting, too numerous to count at home.  Normal vomitus.  No blood in it.  She has no abdominal pain.  No diarrhea.  Her last bowel movement was yesterday and was described as being normal.  She denies any sick contacts.  She has developed some chest pain after her retching.  She has had no cough, but some shortness of breath today.  She describes the chest pain as being substernal in nature, most intense 5/10 and it is burning in character. She has had some pain in her right jaw and earlier today had some left shoulder pain, which has resolved.  She also notes subjective fever and chills and she is currently cold in the emergency department.  Her only other complaint at this point is requesting benzodiazepines for her nerves.  She notes that she has had many episodes of nausea or vomiting, but does has not needed to be hospitalized in the past.  She tells me this episode is very similar, but she cannot tell me why she decided to come to the emergency room at this time versus other times when she has experienced similar symptoms.  Per the discussion with the emergency room physician, this was felt to be noncardiac in  nature.  I have requested the patient to be given aspirin.  In the emergency room, impression was most likely pneumonia causing this and the patient has already been given Rocephin and Zithromax.  Rectal aspirin will be given.  REVIEW OF SYSTEMS:  Positive for subjective fever and chills.  Negative for changes to her vision, sore throat, rash, muscle aches, dysuria, or bleeding, otherwise as above.  PAST MEDICAL HISTORY: 1. Hypertension. 2. Hyperlipidemia. 3. Atrial fibrillation. 4. Hypothyroidism. 5. Right parietal hemorrhage in 2008, not a Coumadin candidate. 6. GERD. 7. IBS. 8. Peptic ulcer disease. 9. Degenerative joint disease. 10.Renal cell carcinoma. 11.Cardiac catheterization greater than 20 years ago, which by report     was unremarkable per Dr. Jenel Lucks last note in E-chart. 12.Echocardiogram from January 2012 per Dr. Jenel Lucks note, known     ejection fraction of 55-60% with grade 1 diastolic dysfunction,     moderate-to-severe aortic stenosis.  PAST SURGICAL HISTORY: 1. Status post left nephrectomy secondary to renal cell carcinoma. 2. EGD. 3. Back surgery. 4. Tonsillectomy. 5. Sinus surgery.  ALLERGIES:  TO VICODIN  AND CODEINE, WHICH CAUSE NAUSEA AND VOMITING.  SOCIAL HISTORY:  She lives alone.  She denies any nearby family.  She denies alcohol or tobacco.  FAMILY HISTORY:  Mother died with a stroke, age 7 and father died at 49 with heart failure.  She has a sister, who recently had open heart surgery.  Review of the EMS documentation notes heart rate is 76 to 101, blood pressure 140/100.  PHYSICAL EXAMINATION:  GENERAL:  The patient examined in the emergency department.  Currently lying on a stretcher on her side and is in no acute distress, but she does appear to be ill. VITAL SIGNS:  Blood pressure is 127/87, pulse 85, respirations 20, temperature 97.9, saturation 96%, currently 94% on oxygen nasal cannula. HEENT:  Head appears to be normal.  Eyes,  sclerae clear.  Pupils equal, round, and reactive to light.  Lids, irises, conjunctivae appear unremarkable.  ENT, hearing is grossly normal.  Lips and tongue appear unremarkable, as the patient is edentulous. NECK:  Supple.  No lymphadenopathy or masses.  No thyromegaly. CHEST:  Decreased breath sounds bilaterally without frank wheezes, rales, or rhonchi.  There is poor air movement.  There is normal respiratory effort.  The patient is able to speak in full sentences. CARDIOVASCULAR:  Regular rhythm.  Normal rate.  2/6 holosystolic murmur. No rub or gallop.  No lower extremity edema. ABDOMEN:  Positive bowel sounds.  Soft, nontender, and nondistended. She has no epigastric or right upper quadrant pain.  She has no epigastric or right upper quadrant pain. NEURO:  Tone and strength in the upper lower extremities is grossly symmetric.  She follows commands well. SKIN:  Normal without rash or induration, nontender to palpation. Notable for some cherry nevi and seborrheic keratoses on her back.  She has onychomycosis of bilateral lower extremity nails.  IMAGING:  Chest x-ray:  Mild left lung base opacity, atelectasis versus infiltrate.  PERTINENT LABORATORY STUDIES: 1. CBC:  Unremarkable. 2. Basic point of care chemistry panel, unremarkable except for random     blood sugar of 176. 3. One set of cardiac markers at point of care are negative. 4. Urinalysis, negative.  ANCILLARY STUDIES: 1. EKG showed atrial fibrillation with ventricular rate of 85,     anteroseptal MI, age unknown.  Appears to be new since August 03, 2010.  Additionally seen on today's EKG is inferolateral T-wave     inversion, which appears to be new from compared to previous EKG.  ASSESSMENT AND PLAN:  This is a 77 year old woman, who presents with acute onset of nausea and vomiting without abdominal pain or diarrhea. 1. Acute onset of nausea and vomiting without diarrhea.  Her abdominal     examination is  benign and she gives no history of abdominal pain.     I doubt a recurrence of peptic ulcer disease.  She does have some     belching, so GERD is a possibility as a contributor here.  Viral     gastroenteritis is also in the differential.  Further in the     differential would be pneumonia.  We will check an abdominal film     to rule out obstruction, which I again doubt by history and     examination and certainly doubt peptic ulcer disease at this time.     This is most likely related to either pneumonia or viral     gastroenteritis at this point. 2. Chest pain.  Probably secondary to retching  as this appears to have     occurred after her retching began.  She has had many     episodes of vomiting without bleeding.  I suspect this is     musculoskeletal in nature.  Nevertheless, she does have a history     of hypertension and hyperlipidemia.  We will rule out with serial     cardiac enzymes.  Follow-up an EKG in the morning.  Certainly, if her     cardiac enzymes were elevated, would consider Cardiac consultation,     but again did not think this is representative of acute coronary     syndrome at this time.  We will also check a serum lipase and     hepatic function panel. 3. Possible left lower lobe pneumonia.  The patient does have a     history of shortness of breath, but no cough.  We will obtain a 2-     view study in the morning to follow this up.  She has received     Rocephin and Zithromax in the emergency room.  We will place her on     Levaquin tomorrow. 4. History of gastroesophageal reflux disease, peptic ulcer disease,     and irritable bowel syndrome.  Plan as above.  We will place her on     Protonix. 5. History of hypertension.  This appears to be stable. 6. History of permanent atrial fibrillation, appears to be stable, not     a Coumadin candidate secondary to previous hemorrhage. 7. Grade 1 diastolic dysfunction, appears clinically compensated.  Medical  reconciliation is currently pending at this point.  The patient does not have her list with her, as she had been given it to the staff to complete the reconciliation.     Brendia Sacks, MD     DG/MEDQ  D:  05/15/2011  T:  05/15/2011  Job:  [id number]  cc:   Dr. Neta Mends C. Daleen Squibb, MD, [facility]  Electronically Signed by Brendia Sacks  on 05/15/2011 03:41:02 PM

## 2011-05-16 ENCOUNTER — Other Ambulatory Visit: Payer: Self-pay

## 2011-05-16 ENCOUNTER — Inpatient Hospital Stay (HOSPITAL_COMMUNITY): Payer: Medicare Other

## 2011-05-16 DIAGNOSIS — R112 Nausea with vomiting, unspecified: Secondary | ICD-10-CM | POA: Diagnosis present

## 2011-05-16 DIAGNOSIS — K219 Gastro-esophageal reflux disease without esophagitis: Secondary | ICD-10-CM | POA: Diagnosis present

## 2011-05-16 LAB — COMPREHENSIVE METABOLIC PANEL
ALT: 7 U/L (ref 0–35)
AST: 13 U/L (ref 0–37)
Alkaline Phosphatase: 68 U/L (ref 39–117)
CO2: 28 mEq/L (ref 19–32)
Chloride: 104 mEq/L (ref 96–112)
Creatinine, Ser: 0.9 mg/dL (ref 0.50–1.10)
GFR calc non Af Amer: 61 mL/min — ABNORMAL LOW (ref >90)
Potassium: 4 mEq/L (ref 3.5–5.1)
Total Bilirubin: 0.3 mg/dL (ref 0.3–1.2)

## 2011-05-16 LAB — URINE CULTURE
Colony Count: NO GROWTH
Culture  Setup Time: 201211031746

## 2011-05-16 LAB — CBC
MCV: 95.1 fL (ref 78.0–100.0)
Platelets: 167 10*3/uL (ref 150–400)
RBC: 3.71 MIL/uL — ABNORMAL LOW (ref 3.87–5.11)
WBC: 8.4 10*3/uL (ref 4.0–10.5)

## 2011-05-16 MED ORDER — PANTOPRAZOLE SODIUM 40 MG IV SOLR
40.0000 mg | Freq: Two times a day (BID) | INTRAVENOUS | Status: DC
  Administered 2011-05-16 – 2011-05-18 (×5): 40 mg via INTRAVENOUS
  Filled 2011-05-16 (×6): qty 40

## 2011-05-16 MED ORDER — METOPROLOL TARTRATE 1 MG/ML IV SOLN
5.0000 mg | Freq: Four times a day (QID) | INTRAVENOUS | Status: DC
  Administered 2011-05-16 – 2011-05-17 (×5): 5 mg via INTRAVENOUS
  Filled 2011-05-16 (×10): qty 5

## 2011-05-16 NOTE — Progress Notes (Signed)
Subjective: Patient states that she still feels rough. She still complains of dry heaving and feeling quite nauseated. She denies any shortness of breath. She does complain of some midsternal chest pain which he describes as a burning, especially after she is vomiting. She denies any diarrhea or constipation.  Objective: Weight change:   Intake/Output Summary (Last 24 hours) at 05/16/11 1135 Last data filed at 05/16/11 0900  Gross per 24 hour  Intake    120 ml  Output      0 ml  Net    120 ml   BP 160/76  Pulse 105  Temp(Src) 99.2 F (37.3 C) (Oral)  Resp 18  Ht 5\' 2"  (1.575 m)  Wt 65.953 kg (145 lb 6.4 oz)  BMI 26.59 kg/m2  SpO2 91% General appearance: alert, cooperative, appears stated age, mild distress, mildly obese and pale Head: Normocephalic, without obvious abnormality, atraumatic, Mucous membranes dry Lungs: diminished breath sounds bibasilar Heart: irregularly irregular rhythm and Mild tachycardia Abdomen: Soft mild tenderness mostly in the upper regions with subjective complaints of pain in the midepigastric area. Mild obese. Hyperactive bowel sounds. Extremities: extremities normal, atraumatic, no cyanosis or edema  Lab Results: Basic Metabolic Panel:  Basename 05/16/11 0700 05/15/11 0938  NA 140 139  K 4.0 4.2  CL 104 101  CO2 28 --  GLUCOSE 130* 176*  BUN 13 17  CREATININE 0.90 1.00  CALCIUM 9.2 --  MG -- --  PHOS -- --   Liver Function Tests:  Basename 05/16/11 0700  AST 13  ALT 7  ALKPHOS 68  BILITOT 0.3  PROT 6.1  ALBUMIN 3.1*    Basename 05/16/11 0700  LIPASE 22  AMYLASE --   No results found for this basename: AMMONIA:2 in the last 72 hours CBC:  Basename 05/16/11 0700 05/15/11 0938 05/15/11 0912  WBC 8.4 -- 8.5  NEUTROABS -- -- 7.5  HGB 11.2* 13.9 --  HCT 35.3* 41.0 --  MCV 95.1 -- 94.9  PLT 167 -- 173   Cardiac Enzymes:  Basename 05/15/11 2104 05/15/11 1831 05/15/11 0912  CKTOTAL 69 72 77  CKMB 2.7 2.9 2.9  CKMBINDEX --  -- --  TROPONINI <0.30 <0.30 --   Fasting Lipid Panel:  Basename 05/15/11 0912  CHOL 140  HDL 55  LDLCALC 63  TRIG 108  CHOLHDL 2.5  LDLDIRECT --    Studies/Results: Dg Chest Portable 1 View  05/15/2011  IMPRESSION: Mild left lung base opacity, likely atelectasis although infiltrate is possible.  Otherwise, no active disease of the chest.  Original Report Authenticated By:    Cathleen Corti 2 Views  05/15/2011   IMPRESSION: Question left lower lobe pneumonia.  No evidence for intraperitoneal free air or bowel obstruction.  Original Report Authenticated By: ERIC A. MANSELL, M.D.   Medications: Scheduled Meds:   . aspirin  81 mg Oral Daily  . diltiazem  240 mg Oral Daily  . levofloxacin (LEVAQUIN) IV  750 mg Intravenous Q24H  . levothyroxine  88 mcg Oral QAC breakfast  . lisinopril  40 mg Oral Daily  . metoprolol succinate  100 mg Oral BID  . mirtazapine  7.5 mg Oral QHS  . pantoprazole (PROTONIX) IV  40 mg Intravenous Q12H  . rosuvastatin  40 mg Oral q1800  . DISCONTD: aspirin EC  81 mg Oral Daily  . DISCONTD: pantoprazole (PROTONIX) IV  40 mg Intravenous QHS   Continuous Infusions:   . sodium chloride     PRN Meds:.acetaminophen, alum & mag  hydroxide-simeth, LORazepam, morphine injection, ondansetron (ZOFRAN) IV, ondansetron, promethazine, traMADol  Assessment/Plan: Patient Active Hospital Problem List: Nausea and vomiting in adult (05/16/2011)   Assessment: Gastroenteritis versus severe uncontrolled GERD because she's been off her medications.    Plan: Currently treating with Maalox, antinausea medications and IV PPI twice a day.   Atrial fibrillation (08/20/2010)   Assessment:  Mild likely secondary to abdominal discomfort plus dehydration from nausea vomiting. tachycardia   Plan: continue home meds plus gentle IV fluids   PUD (08/20/2010)   Assessment: see above, need to watch for active ulceration. At this time no GI consultation indicated   Aortic stenosis (12/09/2010)    Assessment: Stable   Plan: watch for volume overload  GERD (gastroesophageal reflux disease) (05/16/2011)   Assessment: severe requiring chronic PPI treatment. Patient ran out of her medication several days prior and started having severe nausea vomiting after that.    Plan:  see above  HYPOTHYROIDISM (08/20/2010)   Assessment: stable.    Plan: monitor.  HYPERLIPIDEMIA (08/20/2010)   Assessment: stable.    HYPERTENSION (08/20/2010)   Assessment: Monitor blood pressure given lower because of dehydration and higher because of pain and discomfort    Plan: continue home meds  CEREBROVASCULAR ACCIDENT WITH RIGHT HEMIPARESIS (08/20/2010)   Assessment: Stable   Abnormal chest x-ray findings: Will check two-view x-ray to determine if this is atelectasis versus pneumonia which may have occurred during patient's nausea vomiting episodes. Currently no fever. But she did have a positive differential on her CBC.  Patient seen and examined and discussed her treatment plan with the patient as well as family members present. Depending on her improvement of symptoms it is possible that she could be able to go home tomorrow.    LOS: 1 day   Quetzal Meany K 05/16/2011, 11:35 AM

## 2011-05-17 MED ORDER — METOPROLOL TARTRATE 50 MG PO TABS
50.0000 mg | ORAL_TABLET | Freq: Two times a day (BID) | ORAL | Status: DC
  Administered 2011-05-17 – 2011-05-18 (×2): 50 mg via ORAL
  Filled 2011-05-17 (×3): qty 1

## 2011-05-17 MED ORDER — LEVOFLOXACIN 500 MG PO TABS
500.0000 mg | ORAL_TABLET | Freq: Every day | ORAL | Status: DC
  Administered 2011-05-18: 500 mg via ORAL
  Filled 2011-05-17: qty 1

## 2011-05-17 NOTE — Progress Notes (Signed)
Subjective: Patient feeling better. No abdominal pain, nausea or vomiting. Tolerating clear liquids. She did not eat too much in terms of full liquids for lunch however this is because she said she had no appetite. She wants to try solid foods. No other complaints  Objective: Weight change:   Intake/Output Summary (Last 24 hours) at 05/17/11 1702 Last data filed at 05/17/11 1300  Gross per 24 hour  Intake     20 ml  Output    602 ml  Net   -582 ml   BP 116/72  Pulse 91  Temp(Src) 98.1 F (36.7 C) (Oral)  Resp 18  Ht 5\' 2"  (1.575 m)  Wt 65.953 kg (145 lb 6.4 oz)  BMI 26.59 kg/m2  SpO2 90% General appearance: Alert, cooperative, no apparent distress Head: Normocephalic, without obvious abnormality, atraumatic, Mucous membranes dry Lungs: diminished breath sounds bibasilar Heart: irregularly irregular rhythm and Mild tachycardia Abdomen: Soft, nontender, nondistended, minimal bowel sounds Extremities: extremities normal, atraumatic, no cyanosis or edema  Lab Results: Basic Metabolic Panel:  Basename 05/16/11 0700 05/15/11 0938  NA 140 139  K 4.0 4.2  CL 104 101  CO2 28 --  GLUCOSE 130* 176*  BUN 13 17  CREATININE 0.90 1.00  CALCIUM 9.2 --  MG -- --  PHOS -- --   Liver Function Tests:  Basename 05/16/11 0700  AST 13  ALT 7  ALKPHOS 68  BILITOT 0.3  PROT 6.1  ALBUMIN 3.1*    Basename 05/16/11 0700  LIPASE 22  AMYLASE --   No results found for this basename: AMMONIA:2 in the last 72 hours CBC:  Basename 05/16/11 0700 05/15/11 0938 05/15/11 0912  WBC 8.4 -- 8.5  NEUTROABS -- -- 7.5  HGB 11.2* 13.9 --  HCT 35.3* 41.0 --  MCV 95.1 -- 94.9  PLT 167 -- 173   Cardiac Enzymes:  Basename 05/15/11 2104 05/15/11 1831 05/15/11 0912  CKTOTAL 69 72 77  CKMB 2.7 2.9 2.9  CKMBINDEX -- -- --  TROPONINI <0.30 <0.30 --   Fasting Lipid Panel:  Basename 05/15/11 0912  CHOL 140  HDL 55  LDLCALC 63  TRIG 108  CHOLHDL 2.5  LDLDIRECT --     Studies/Results: Dg Chest Portable 1 View  05/15/2011  IMPRESSION: Mild left lung base opacity, likely atelectasis although infiltrate is possible.  Otherwise, no active disease of the chest.  Original Report Authenticated By:    Cathleen Corti 2 Views  05/15/2011   IMPRESSION: Question left lower lobe pneumonia.  No evidence for intraperitoneal free air or bowel obstruction.  Original Report Authenticated By: ERIC A. MANSELL, M.D.   Medications: Scheduled Meds:    . aspirin  81 mg Oral Daily  . diltiazem  240 mg Oral Daily  . levofloxacin  500 mg Oral Daily  . levothyroxine  88 mcg Oral QAC breakfast  . lisinopril  40 mg Oral Daily  . metoprolol tartrate  50 mg Oral BID  . mirtazapine  7.5 mg Oral QHS  . pantoprazole (PROTONIX) IV  40 mg Intravenous Q12H  . DISCONTD: levofloxacin (LEVAQUIN) IV  750 mg Intravenous Q24H  . DISCONTD: metoprolol  5 mg Intravenous Q6H   Continuous Infusions:    . sodium chloride     PRN Meds:.acetaminophen, alum & mag hydroxide-simeth, LORazepam, morphine injection, ondansetron (ZOFRAN) IV, ondansetron, promethazine, traMADol  Assessment/Plan: Patient Active Hospital Problem List: Nausea and vomiting in adult (05/16/2011)   Assessment: Likely severe reflux from not having PPI for several days. Has  responded well to treatment therapy. Advancing diet.   Atrial fibrillation (08/20/2010)   Assessment:  Mild likely secondary to abdominal discomfort plus dehydration from nausea vomiting. tachycardia   Plan: continue home meds plus gentle IV fluids   Pneumonia: Mild on 2 view x-ray. We'll change Levaquin to by mouth  PUD (08/20/2010)   Assessment: see above, need to watch for active ulceration. At this time no GI consultation indicated   Aortic stenosis (12/09/2010)   Assessment: Stable   Plan: watch for volume overload  GERD (gastroesophageal reflux disease) (05/16/2011)   Assessment: severe requiring chronic PPI treatment. Patient ran out of her  medication several days prior and started having severe nausea vomiting after that.    Plan:  see above  HYPOTHYROIDISM (08/20/2010)   Assessment: stable.    Plan: monitor.  HYPERLIPIDEMIA (08/20/2010)   Assessment: stable.    HYPERTENSION (08/20/2010)   Assessment: Monitor blood pressure given lower because of dehydration and higher because of pain and discomfort    Plan: continue home meds  CEREBROVASCULAR ACCIDENT WITH RIGHT HEMIPARESIS (08/20/2010)   Assessment: Stable       LOS: 2 days   Kostas Marrow K 05/17/2011, 5:02 PM

## 2011-05-17 NOTE — Progress Notes (Signed)
Please see shadow chart for Care M[facility] documentation  

## 2011-05-18 DIAGNOSIS — J189 Pneumonia, unspecified organism: Secondary | ICD-10-CM | POA: Diagnosis present

## 2011-05-18 MED ORDER — PROMETHAZINE HCL 12.5 MG PO TABS: 6.2500 mg | ORAL_TABLET | Freq: Four times a day (QID) | ORAL | Status: DC | PRN

## 2011-05-18 MED ORDER — ALUM & MAG HYDROXIDE-SIMETH 400-400-40 MG/5ML PO SUSP: 30.0000 mL | Freq: Four times a day (QID) | ORAL | Status: AC | PRN

## 2011-05-18 MED ORDER — AMITRIPTYLINE HCL 25 MG PO TABS: 25.0000 mg | ORAL_TABLET | Freq: Every day | ORAL | Status: DC

## 2011-05-18 MED ORDER — LEVOFLOXACIN 500 MG PO TABS: 500.0000 mg | ORAL_TABLET | Freq: Every day | ORAL | Status: AC

## 2011-05-18 NOTE — Progress Notes (Signed)
Utilization Review Completed.  Jenny Omdahl T  05/18/2011 

## 2011-05-18 NOTE — Progress Notes (Addendum)
Pt lives alone.She would like hhc. Gave pt hhc agency list and copy left in chart. No pref to hhc agency. Ref in tlc and to [facility] for hhrn and phy ther .HOME HEALTH AGENCIES SERVING [place]   Agencies that are Medicare-Certified and are affiliated with [facility]  Telephone Number Address  [facility]   [facility] has ownership interest in this company; however, you are under no obligation to use this agency. [phone] or  [phone] [address] [place], [place] [address]   Agencies that are Medicare-Certified and are not affiliated with [facility] Telephone Number Address  [facility] [phone] Fax [phone] [address] [place], [place]  [address]  [facility] [phone] or [phone] Fax [phone] [address] [place], [place] [address]  [facility] [phone] Fax [phone] [address] [place], [place] [address]  [facility] [phone] Fax [phone] [address] [place], [place]  [address]  [facility] [phone] Fax [phone] [address], Suite [place], [place] [address]  [facility] [phone] [address] [place], [place] [address]  [facility] [phone]  [address] [place], [place] [address]  [facility] [phone] or [phone] Fax [phone] [address] [place], [place]  [address]  [facility] [phone] Fax [phone] [address] [place], [place]  [address]  [facility]  [phone] Fax [phone] [address] [place], [place] [address]

## 2011-05-18 NOTE — Discharge Summary (Signed)
DISCHARGE SUMMARY  [name]  MR#: [id number]  DOB:16-Jan-1933  Date of Admission: 05/15/2011 Date of Discharge: 05/18/2011  Attending Physician:[name]  Patient's NWG:NFAOZ,Janet DENNIS, MD  Consults:  none  Discharge Diagnoses: Present on Admission:  .Atrial fibrillation .HYPOTHYROIDISM .HYPERLIPIDEMIA .Aortic stenosis .HYPERTENSION .CEREBROVASCULAR ACCIDENT WITH RIGHT HEMIPARESIS .PUD .GERD (gastroesophageal reflux disease) .Nausea and vomiting in adult .Pneumonia    Current Discharge Medication List    START taking these medications   Details  alum & mag hydroxide-simeth (MAALOX PLUS) 400-400-40 MG/5ML suspension Take 30 mLs by mouth every 6 (six) hours as needed. Qty: 355 mL    levofloxacin (LEVAQUIN) 500 MG tablet Take 1 tablet (500 mg total) by mouth daily. Qty: 3 tablet, Refills: 0    promethazine (PHENERGAN) 12.5 MG tablet Take 0.5 tablets (6.25 mg total) by mouth every 6 (six) hours as needed for nausea. Qty: 30 tablet, Refills: 0      CONTINUE these medications which have NOT CHANGED   Details  amitriptyline (ELAVIL) 25 MG tablet Take 1 tablet (25 mg total) by mouth at bedtime.   aspirin 81 MG tablet  Take 81 mg by mouth every evening.     CARTIA XT 240 MG 24 hr capsule TAKE ONE CAPSULE BY MOUTH DAILY Qty: 30 capsule, Refills: 5    levothyroxine (SYNTHROID, LEVOTHROID) 88 MCG tablet Take 88 mcg by mouth daily.     lisinopril (PRINIVIL,ZESTRIL) 40 MG tablet Take 40 mg by mouth every evening.     metoprolol (TOPROL-XL) 100 MG 24 hr tablet Take 100 mg by mouth 2 (two) times daily.     mirtazapine (REMERON) 15 MG tablet Take 7.5 mg by mouth at bedtime.      pantoprazole (PROTONIX) 40 MG tablet TAKE ONE TABLET BY MOUTH DAILY Qty: 30 tablet, Refills: 11    rosuvastatin (CRESTOR) 40 MG tablet Take 40 mg by mouth daily.      traMADol (ULTRAM) 50 MG tablet Take 50 mg by mouth every 6 (six) hours as needed. For pain. Maximum dose= 8  tablets per day           Hospital Course: Present on Admission:  .Atrial fibrillation: Paroxysmal. Stable during this admission.  Marland KitchenHYPOTHYROIDISM: Continue on Synthroid. Stable.  Marland KitchenHYPERLIPIDEMIA: Stable during this admission.  .Aortic stenosis: Stable. Have watched IV fluid ministration closely to prevent Lyme overload.  Marland KitchenHYPERTENSION: Initially patient required IV Lopressor because she was unable to keep anything down. This is unchanged her back her home dose.  .CEREBROVASCULAR ACCIDENT WITH RIGHT HEMIPARESIS: Stable and getting home health PT to followup.  .PUD: See below  .GERD (gastroesophageal reflux disease): Patient missed several days of daily PPI which led to severe reflux attack leading to forceful nausea and vomiting prolonged.  She required IV PPI and Maalox to resolve her symptoms. Currently she is able to keep down solid food. She's not had a vomiting episode since.  .Nausea and vomiting in adult: Initially this was felt to be secondary to severe reflux attack. This is currently stabilizing she is able to tolerate by mouth. She still having some mild nausea and no appetite, which is felt to actually be secondary to side effect from antibiotic.  Marland KitchenPneumonia: Patient had some minimal aspiration during her forceful nausea vomiting episodes. She normal white count with a mild shift. Chest x-ray portable initially on admission noted left base atelectasis versus possible infiltrate which was confirmed with mild air space disease and mild pneumonia on 2 view x-ray on hospital day 3. Patient has  received 4 days of antibiotics initially IV then changed to by mouth. She will be discharged home with 3 more days of by mouth antibiotics for total 7 days. It is felt the antibiotics are contributing to her nausea.  Day of Discharge BP 136/85  Pulse 97  Temp(Src) 97.7 F (36.5 C) (Oral)  Resp 20  Ht 5\' 2"  (1.575 m)  Wt 65.953 kg (145 lb 6.4 oz)  BMI 26.59 kg/m2  SpO2 94%  Physical  Exam: General appearance: Alert, cooperative, no apparent distress  Head: Normocephalic, without obvious abnormality, atraumatic, Mucous membranes dry  Lungs: diminished breath sounds bibasilar  Heart: irregularly irregular rhythm, rate controlled Abdomen: Soft, nontender, nondistended, minimal bowel sounds  Extremities: extremities normal, atraumatic, no cyanosis or edema  Disposition: Improved   Follow-up Appts: Discharge Orders    Future Orders Please Complete By Expires   Diet - low sodium heart healthy      Increase activity slowly , patient is being set up with home health PT and RN         Follow-up with Dr.Kohut,  PCP in  2 weeks.   Tests Needing Follow-up:  None  Signed: Nimrit Kehres [name] 05/18/2011, 11:56 AM

## 2012-01-20 ENCOUNTER — Ambulatory Visit (INDEPENDENT_AMBULATORY_CARE_PROVIDER_SITE_OTHER): Payer: Medicare Other | Admitting: Cardiology

## 2012-01-20 ENCOUNTER — Encounter: Payer: Self-pay | Admitting: Cardiology

## 2012-01-20 VITALS — BP 110/66 | HR 62 | Ht 62.0 in | Wt 138.0 lb

## 2012-01-20 DIAGNOSIS — I495 Sick sinus syndrome: Secondary | ICD-10-CM

## 2012-01-20 DIAGNOSIS — I350 Nonrheumatic aortic (valve) stenosis: Secondary | ICD-10-CM

## 2012-01-20 DIAGNOSIS — I69959 Hemiplegia and hemiparesis following unspecified cerebrovascular disease affecting unspecified side: Secondary | ICD-10-CM

## 2012-01-20 DIAGNOSIS — I4891 Unspecified atrial fibrillation: Secondary | ICD-10-CM

## 2012-01-20 DIAGNOSIS — I359 Nonrheumatic aortic valve disorder, unspecified: Secondary | ICD-10-CM

## 2012-01-20 NOTE — Assessment & Plan Note (Signed)
Ratet well-controlled, not a Coumadin candidate.

## 2012-01-20 NOTE — Patient Instructions (Addendum)
Your physician wants you to follow-up in: 1 year with Dr. Wall. You will receive a reminder letter in the mail two months in advance. If you don't receive a letter, please call our office to schedule the follow-up appointment.  

## 2012-01-20 NOTE — Assessment & Plan Note (Signed)
Stable by history and clinical exam. Continue to monitor her with an echocardiogram next year. She is not a candidate for aortic valve replacement. She potassium be a candidate for TAVR or aortic valvular plasty.

## 2012-01-20 NOTE — Progress Notes (Signed)
HPI [name] returns today for evaluation and management of her history of chronic A. fib, history of mitral valve prolapse, history of moderate aortic stenosis last echo last year, hypertension, moderate LVH, history of a right parietal bleed not a Coumadin candidate, history of hyperlipidemia, history of hemiplegia from her stroke, history of chest pain with a negative Myoview in June of 2012.  She still lives alone. She prepares all her food. Family lives by to help. She is followed by Dr. Juleen China of  primary care. Compliant with medications.  She denies any orthopnea, PND, but does have dyspnea on exertion. Her ambulation is limited by her residual hemiplegia. She does use a cane and has not fallen.  She denies any palpitations or syncope. She denies any residual problems with chest pain.  Past Medical History  Diagnosis Date  . Permanent atrial fibrillation   . Mitral valve prolapse   . Sinus bradycardia-tachycardia syndrome   . Hemiplegia affecting unspecified side, late effect of cerebrovascular disease   . Hypertension   . Malignant neoplasm of kidney, except pelvis     s/p L nephrectomy  . Hyperlipidemia   . Osteoarthrosis, unspecified whether generalized or localized, unspecified site   . Peptic ulcer, unspecified site, unspecified as acute or chronic, without mention of hemorrhage, perforation, or obstruction   . Hypothyroidism   . Intracranial bleed     h/o right parietal bleed; not a coumadin candidate  . Aortic stenosis     a. echo 1/12: Ef 55-60%, mod LVH, grade 1 diast dysfxn, mod to severe AS with AVA 0.94 (Vmax) and mean gradient 23 mmHg, PASP 33;   b. echo 6/12: mod LVH, EF 65%, mod AS with mean gradient 28 mmHg, mild to mod MR, mild RAE, no change since 1/12  . Chest pain     Myoview 6/12: no ischemia, EF 64%    Current Outpatient Prescriptions  Medication Sig Dispense Refill  . amitriptyline (ELAVIL) 25 MG tablet Take 1 tablet (25 mg total) by mouth at bedtime.       Marland Kitchen aspirin 81 MG tablet Take 81 mg by mouth every evening.       Marland Kitchen CARTIA XT 240 MG 24 hr capsule TAKE ONE CAPSULE BY MOUTH DAILY  30 capsule  5  . levothyroxine (SYNTHROID, LEVOTHROID) 88 MCG tablet Take 88 mcg by mouth daily.       Marland Kitchen lisinopril (PRINIVIL,ZESTRIL) 40 MG tablet Take 40 mg by mouth every evening.       . metoprolol (TOPROL-XL) 100 MG 24 hr tablet Take 100 mg by mouth 2 (two) times daily.       . mirtazapine (REMERON) 15 MG tablet Take 7.5 mg by mouth at bedtime.        . pantoprazole (PROTONIX) 40 MG tablet TAKE ONE TABLET BY MOUTH DAILY  30 tablet  11  . rosuvastatin (CRESTOR) 40 MG tablet Take 40 mg by mouth daily.        . traMADol (ULTRAM) 50 MG tablet Take 50 mg by mouth every 6 (six) hours as needed. For pain. Maximum dose= 8 tablets per day         Allergies  Allergen Reactions  . Codeine Other (See Comments)    Reaction unknown  . Hydrocodone Other (See Comments)    Unknown reaction   . Plavix (Clopidogrel Bisulfate)   . Warfarin And Related     Family History  Problem Relation Age of Onset  . Transient ischemic attack Mother   .  Heart disease Father   . Arrhythmia Sister   . Arrhythmia Brother     History   Social History  . Marital Status: Widowed    Spouse Name: N/A    Number of Children: N/A  . Years of Education: N/A   Occupational History  . Not on file.   Social History Main Topics  . Smoking status: Never Smoker   . Smokeless tobacco: Not on file  . Alcohol Use: No  . Drug Use: No  . Sexually Active: Not on file   Other Topics Concern  . Not on file   Social History Narrative  . No narrative on file    ROS ALL NEGATIVE EXCEPT THOSE NOTED IN HPI  PE  General Appearance: well developed, well nourished in no acute distress, obese, chronically ill-appearing HEENT: symmetrical face, PERRLA, good dentition  Neck: no JVD, thyromegaly, or adenopathy, trachea midline Chest: symmetric without deformity Cardiac: PMI non-displaced,  irregular rate and rhythm normal S1, S2, no gallop, 2/6 systolic murmur consistent with aortic stenosis. S2 does split with inspiration. Lung: clear to ausculation and percussion Vascular: all pulses full without bruits  Abdominal: nondistended, nontender, good bowel sounds, no HSM, no bruits Extremities: Minimal edema bilaterally, dependent rubor, toes are cool but pulses intact, no sign of DVT, superficial varicosities  Skin: Pale which is baseline, no rashes Neuro: alert and oriented x 3, non-focal Pysch: normal affect  EKG Atrial fib, poor R. progression, rate well controlled, no acute changes BMET    Component Value Date/Time   NA 140 05/16/2011 0700   K 4.0 05/16/2011 0700   CL 104 05/16/2011 0700   CO2 28 05/16/2011 0700   GLUCOSE 130* 05/16/2011 0700   BUN 13 05/16/2011 0700   CREATININE 0.90 05/16/2011 0700   CALCIUM 9.2 05/16/2011 0700   GFRNONAA 61* 05/16/2011 0700   GFRAA 70* 05/16/2011 0700    Lipid Panel     Component Value Date/Time   CHOL 140 05/15/2011 0912   TRIG 108 05/15/2011 0912   HDL 55 05/15/2011 0912   CHOLHDL 2.5 05/15/2011 0912   VLDL 22 05/15/2011 0912   LDLCALC 63 05/15/2011 0912    CBC    Component Value Date/Time   WBC 8.4 05/16/2011 0700   RBC 3.71* 05/16/2011 0700   HGB 11.2* 05/16/2011 0700   HCT 35.3* 05/16/2011 0700   PLT 167 05/16/2011 0700   MCV 95.1 05/16/2011 0700   MCH 30.2 05/16/2011 0700   MCHC 31.7 05/16/2011 0700   RDW 14.6 05/16/2011 0700   LYMPHSABS 0.6* 05/15/2011 0912   MONOABS 0.4 05/15/2011 0912   EOSABS 0.0 05/15/2011 0912   BASOSABS 0.0 05/15/2011 0912

## 2012-01-31 ENCOUNTER — Telehealth: Payer: Self-pay | Admitting: Cardiology

## 2012-01-31 NOTE — Telephone Encounter (Signed)
Please return call to patient daughter Gavin Pound [phone], concerning medical care

## 2012-01-31 NOTE — Telephone Encounter (Signed)
LMTCB Debbie Anda Sobotta RN  

## 2012-03-24 ENCOUNTER — Encounter (HOSPITAL_COMMUNITY): Payer: Self-pay | Admitting: Emergency Medicine

## 2012-03-24 ENCOUNTER — Emergency Department (HOSPITAL_COMMUNITY): Payer: Medicare Other

## 2012-03-24 ENCOUNTER — Inpatient Hospital Stay (HOSPITAL_COMMUNITY)
Admission: EM | Admit: 2012-03-24 | Discharge: 2012-03-29 | DRG: 682 | Disposition: A | Discharge status: Hospice | Payer: Medicare Other | Attending: Internal Medicine | Admitting: Internal Medicine

## 2012-03-24 DIAGNOSIS — C649 Malignant neoplasm of unspecified kidney, except renal pelvis: Secondary | ICD-10-CM | POA: Diagnosis present

## 2012-03-24 DIAGNOSIS — I69959 Hemiplegia and hemiparesis following unspecified cerebrovascular disease affecting unspecified side: Secondary | ICD-10-CM

## 2012-03-24 DIAGNOSIS — Z515 Encounter for palliative care: Secondary | ICD-10-CM

## 2012-03-24 DIAGNOSIS — I509 Heart failure, unspecified: Secondary | ICD-10-CM | POA: Diagnosis present

## 2012-03-24 DIAGNOSIS — K219 Gastro-esophageal reflux disease without esophagitis: Secondary | ICD-10-CM | POA: Diagnosis present

## 2012-03-24 DIAGNOSIS — R451 Restlessness and agitation: Secondary | ICD-10-CM

## 2012-03-24 DIAGNOSIS — R0789 Other chest pain: Secondary | ICD-10-CM | POA: Diagnosis present

## 2012-03-24 DIAGNOSIS — N390 Urinary tract infection, site not specified: Secondary | ICD-10-CM | POA: Diagnosis present

## 2012-03-24 DIAGNOSIS — Z8249 Family history of ischemic heart disease and other diseases of the circulatory system: Secondary | ICD-10-CM

## 2012-03-24 DIAGNOSIS — N179 Acute kidney failure, unspecified: Principal | ICD-10-CM | POA: Diagnosis present

## 2012-03-24 DIAGNOSIS — J189 Pneumonia, unspecified organism: Secondary | ICD-10-CM | POA: Diagnosis present

## 2012-03-24 DIAGNOSIS — R109 Unspecified abdominal pain: Secondary | ICD-10-CM | POA: Diagnosis present

## 2012-03-24 DIAGNOSIS — I5033 Acute on chronic diastolic (congestive) heart failure: Secondary | ICD-10-CM | POA: Diagnosis present

## 2012-03-24 DIAGNOSIS — N189 Chronic kidney disease, unspecified: Secondary | ICD-10-CM | POA: Diagnosis present

## 2012-03-24 DIAGNOSIS — F419 Anxiety disorder, unspecified: Secondary | ICD-10-CM

## 2012-03-24 DIAGNOSIS — E875 Hyperkalemia: Secondary | ICD-10-CM | POA: Diagnosis present

## 2012-03-24 DIAGNOSIS — I059 Rheumatic mitral valve disease, unspecified: Secondary | ICD-10-CM

## 2012-03-24 DIAGNOSIS — G9340 Encephalopathy, unspecified: Secondary | ICD-10-CM | POA: Diagnosis not present

## 2012-03-24 DIAGNOSIS — R52 Pain, unspecified: Secondary | ICD-10-CM

## 2012-03-24 DIAGNOSIS — I350 Nonrheumatic aortic (valve) stenosis: Secondary | ICD-10-CM | POA: Diagnosis present

## 2012-03-24 DIAGNOSIS — I5032 Chronic diastolic (congestive) heart failure: Secondary | ICD-10-CM | POA: Diagnosis present

## 2012-03-24 DIAGNOSIS — G9349 Other encephalopathy: Secondary | ICD-10-CM | POA: Diagnosis present

## 2012-03-24 DIAGNOSIS — R112 Nausea with vomiting, unspecified: Secondary | ICD-10-CM | POA: Diagnosis present

## 2012-03-24 DIAGNOSIS — Z66 Do not resuscitate: Secondary | ICD-10-CM | POA: Diagnosis present

## 2012-03-24 DIAGNOSIS — Z79899 Other long term (current) drug therapy: Secondary | ICD-10-CM

## 2012-03-24 DIAGNOSIS — E785 Hyperlipidemia, unspecified: Secondary | ICD-10-CM | POA: Diagnosis present

## 2012-03-24 DIAGNOSIS — Z905 Acquired absence of kidney: Secondary | ICD-10-CM

## 2012-03-24 DIAGNOSIS — I129 Hypertensive chronic kidney disease with stage 1 through stage 4 chronic kidney disease, or unspecified chronic kidney disease: Secondary | ICD-10-CM | POA: Diagnosis present

## 2012-03-24 DIAGNOSIS — I10 Essential (primary) hypertension: Secondary | ICD-10-CM | POA: Diagnosis present

## 2012-03-24 DIAGNOSIS — Z85528 Personal history of other malignant neoplasm of kidney: Secondary | ICD-10-CM

## 2012-03-24 DIAGNOSIS — F411 Generalized anxiety disorder: Secondary | ICD-10-CM | POA: Diagnosis present

## 2012-03-24 DIAGNOSIS — R079 Chest pain, unspecified: Secondary | ICD-10-CM | POA: Diagnosis present

## 2012-03-24 DIAGNOSIS — I359 Nonrheumatic aortic valve disorder, unspecified: Secondary | ICD-10-CM | POA: Diagnosis present

## 2012-03-24 DIAGNOSIS — I4891 Unspecified atrial fibrillation: Secondary | ICD-10-CM | POA: Diagnosis present

## 2012-03-24 DIAGNOSIS — Z23 Encounter for immunization: Secondary | ICD-10-CM

## 2012-03-24 DIAGNOSIS — K279 Peptic ulcer, site unspecified, unspecified as acute or chronic, without hemorrhage or perforation: Secondary | ICD-10-CM | POA: Diagnosis present

## 2012-03-24 DIAGNOSIS — Z823 Family history of stroke: Secondary | ICD-10-CM

## 2012-03-24 DIAGNOSIS — M1990 Unspecified osteoarthritis, unspecified site: Secondary | ICD-10-CM

## 2012-03-24 DIAGNOSIS — E039 Hypothyroidism, unspecified: Secondary | ICD-10-CM | POA: Diagnosis present

## 2012-03-24 DIAGNOSIS — K117 Disturbances of salivary secretion: Secondary | ICD-10-CM

## 2012-03-24 LAB — BASIC METABOLIC PANEL
CO2: 28 mEq/L (ref 19–32)
Chloride: 96 mEq/L (ref 96–112)
GFR calc Af Amer: 16 mL/min — ABNORMAL LOW (ref >90)
Potassium: 5.5 mEq/L — ABNORMAL HIGH (ref 3.5–5.1)

## 2012-03-24 LAB — CBC WITH DIFFERENTIAL/PLATELET
Basophils Absolute: 0 10*3/uL (ref 0.0–0.1)
Basophils Relative: 0 % (ref 0–1)
Hemoglobin: 12.8 g/dL (ref 12.0–15.0)
Lymphocytes Relative: 7 % — ABNORMAL LOW (ref 12–46)
MCHC: 33.5 g/dL (ref 30.0–36.0)
Neutro Abs: 8.8 10*3/uL — ABNORMAL HIGH (ref 1.7–7.7)
Neutrophils Relative %: 85 % — ABNORMAL HIGH (ref 43–77)
RDW: 13.9 % (ref 11.5–15.5)
WBC: 10.4 10*3/uL (ref 4.0–10.5)

## 2012-03-24 LAB — PRO B NATRIURETIC PEPTIDE: Pro B Natriuretic peptide (BNP): 11270 pg/mL — ABNORMAL HIGH (ref 0–450)

## 2012-03-24 LAB — TROPONIN I: Troponin I: <0.3 ng/mL (ref <0.30)

## 2012-03-24 MED ORDER — ASPIRIN 81 MG PO CHEW
162.0000 mg | CHEWABLE_TABLET | Freq: Once | ORAL | Status: AC
  Administered 2012-03-24: 162 mg via ORAL
  Filled 2012-03-24: qty 2

## 2012-03-24 MED ORDER — FAMOTIDINE IN NACL 20-0.9 MG/50ML-% IV SOLN
20.0000 mg | Freq: Once | INTRAVENOUS | Status: AC
  Administered 2012-03-24: 20 mg via INTRAVENOUS
  Filled 2012-03-24: qty 50

## 2012-03-24 NOTE — ED Notes (Signed)
Pt reports CP has decreased to a 5/10 and is now in left upper breast area, area is tender to touch.

## 2012-03-24 NOTE — ED Notes (Signed)
Pt placed on 2L [place]

## 2012-03-24 NOTE — ED Notes (Signed)
Per EMS - pt c/o CP, a.fib on monitor. Pt given Zofran 4mg , 2 Nitro and 4 ASA. Pt unable to keep food down X 2 days. Pt c/o fever and diaphorisis, along with being more tired with walking. 18G L forearm. 2L/min O2. CP starts mid-sternal and radiates to back and right arm.

## 2012-03-24 NOTE — ED Provider Notes (Signed)
History     CSN: [id number]  Arrival date & time 03/24/12  2039   First MD Initiated Contact with Patient 03/24/12 2110      Chief Complaint  Patient presents with  . Chest Pain    (Consider location/radiation/quality/duration/timing/severity/associated sxs/prior treatment) HPI Comments: Pt comes in with cc of chest pain, sob. Pt has hx of AS, CHF. Per patient, over the past 2 days she has been having some chest pain, left sided, occasionally radiating to the shoulder. There is also some sob that is worse than usual. Pt had associated diaphoresis at one point and felt like she was going to pass out. Denies any n/v/f/c. No new cough. No hx of PE, DVT.  Patient is a 78 y.o. female presenting with chest pain. The history is provided by the patient.  Chest Pain Primary symptoms include shortness of breath and dizziness. Pertinent negatives for primary symptoms include no palpitations, no abdominal pain, no nausea and no vomiting.  Dizziness also occurs with weakness. Dizziness does not occur with nausea or vomiting.  Associated symptoms include weakness.     Past Medical History  Diagnosis Date  . Permanent atrial fibrillation   . Mitral valve prolapse   . Sinus bradycardia-tachycardia syndrome   . Hemiplegia affecting unspecified side, late effect of cerebrovascular disease   . Hypertension   . Malignant neoplasm of kidney, except pelvis     s/p L nephrectomy  . Hyperlipidemia   . Osteoarthrosis, unspecified whether generalized or localized, unspecified site   . Peptic ulcer, unspecified site, unspecified as acute or chronic, without mention of hemorrhage, perforation, or obstruction   . Hypothyroidism   . Intracranial bleed     h/o right parietal bleed; not a coumadin candidate  . Aortic stenosis     a. echo 1/12: Ef 55-60%, mod LVH, grade 1 diast dysfxn, mod to severe AS with AVA 0.94 (Vmax) and mean gradient 23 mmHg, PASP 33;   b. echo 6/12: mod LVH, EF 65%, mod AS with mean  gradient 28 mmHg, mild to mod MR, mild RAE, no change since 1/12  . Chest pain     Myoview 6/12: no ischemia, EF 64%    Past Surgical History  Procedure Date  . Tonsillectomy   . Back surgery     degenerative joint disease    Family History  Problem Relation Age of Onset  . Transient ischemic attack Mother   . Heart disease Father   . Arrhythmia Sister   . Arrhythmia Brother     History  Substance Use Topics  . Smoking status: Never Smoker   . Smokeless tobacco: Not on file  . Alcohol Use: No    OB History    Grav Para Term Preterm Abortions TAB SAB Ect Mult Living                  Review of Systems  Constitutional: Positive for activity change.  HENT: Negative for neck pain.   Respiratory: Positive for shortness of breath.   Cardiovascular: Positive for chest pain. Negative for palpitations.  Gastrointestinal: Negative for nausea, vomiting and abdominal pain.  Genitourinary: Negative for dysuria.  Neurological: Positive for dizziness, weakness and light-headedness. Negative for syncope and headaches.  Hematological: Bruises/bleeds easily.    Allergies  Codeine; Hydrocodone; Plavix; and Warfarin and related  Home Medications   Current Outpatient Rx  Name Route Sig Dispense Refill  . AMITRIPTYLINE HCL 25 MG PO TABS Oral Take 1 tablet (25 mg total) by  mouth at bedtime.    . ASPIRIN 81 MG PO TABS Oral Take 81 mg by mouth every evening.     Marland Kitchen CARTIA XT 240 MG PO CP24  TAKE ONE CAPSULE BY MOUTH DAILY 30 capsule 5  . DICLOFENAC SODIUM 1 % TD GEL Topical Apply 2 g topically daily as needed. For pain    . LEVOTHYROXINE SODIUM 88 MCG PO TABS Oral Take 88 mcg by mouth daily.     Marland Kitchen LISINOPRIL 40 MG PO TABS Oral Take 40 mg by mouth every evening.     Marland Kitchen METOPROLOL SUCCINATE ER 100 MG PO TB24 Oral Take 100 mg by mouth 2 (two) times daily.     Marland Kitchen PANTOPRAZOLE SODIUM 40 MG PO TBEC  TAKE ONE TABLET BY MOUTH DAILY 30 tablet 11  . PROMETHAZINE HCL 12.5 MG PO TABS Oral Take 12.5  mg by mouth every 6 (six) hours as needed. For nausea    . ROSUVASTATIN CALCIUM 40 MG PO TABS Oral Take 20 mg by mouth daily.     . TRAMADOL HCL 50 MG PO TABS Oral Take 50 mg by mouth every 6 (six) hours as needed. For pain. Maximum dose= 8 tablets per day     . PROMETHAZINE HCL 12.5 MG PO TABS Oral Take 0.5 tablets (6.25 mg total) by mouth every 6 (six) hours as needed for nausea. 30 tablet 0    BP 163/99  Resp 25  SpO2 91%  Physical Exam  Nursing note and vitals reviewed. Constitutional: She is oriented to person, place, and time. She appears well-developed and well-nourished.  HENT:  Head: Normocephalic and atraumatic.  Eyes: EOM are normal. Pupils are equal, round, and reactive to light.  Neck: Neck supple.  Cardiovascular: Normal rate and regular rhythm.   Murmur heard. Pulmonary/Chest: Effort normal. No respiratory distress.  Abdominal: Soft. She exhibits no distension. There is no tenderness. There is no rebound and no guarding.  Musculoskeletal: She exhibits edema.       LLE is slightly swollen compared to the right side  Neurological: She is alert and oriented to person, place, and time.  Skin: Skin is warm and dry. Rash noted.    ED Course  Procedures (including critical care time)  Labs Reviewed  CBC WITH DIFFERENTIAL - Abnormal; Notable for the following:    Neutrophils Relative 85 (*)     Neutro Abs 8.8 (*)     Lymphocytes Relative 7 (*)     All other components within normal limits  BASIC METABOLIC PANEL - Abnormal; Notable for the following:    Sodium 134 (*)     Potassium 5.5 (*)     Glucose, Bld 110 (*)     BUN 34 (*)     Creatinine, Ser 3.05 (*)     GFR calc non Af Amer 14 (*)     GFR calc Af Amer 16 (*)     All other components within normal limits  PRO B NATRIURETIC PEPTIDE - Abnormal; Notable for the following:    Pro B Natriuretic peptide (BNP) 11270.0 (*)     All other components within normal limits  TROPONIN I  URINALYSIS, ROUTINE W REFLEX  MICROSCOPIC   Dg Chest Port 1 View  03/24/2012  *RADIOLOGY REPORT*  Clinical Data: Arrhythmia.  History of hypertension.  PORTABLE CHEST - 1 VIEW 03/24/2012 2201 hours:  Comparison: Two-view chest x-ray 05/16/2011.  Findings: Suboptimal inspiration accounts for crowded bronchovascular markings, especially in the lung bases, and accentuates the  cardiac silhouette.  Taking this into account, cardiac silhouette normal in size for AP portable technique. Chronic pleuroparenchymal scarring at the left base with blunting of the left costophrenic angle.  Lungs otherwise clear.  IMPRESSION: No acute cardiopulmonary disease.  Chronic pleuroparenchymal scarring at the left base.   Original Report Authenticated By: Arnell Sieving, M.D.      No diagnosis found.    MDM  Differential diagnosis includes: ACS syndrome CHF exacerbation Valvular disorder Myocarditis Pericarditis Pericardial effusion Pneumonia Pleural effusion Pulmonary edema PE Anemia Musculoskeletal pain  Pt comes in with cc of chest pain and sob. She has hx of AS. Exam is unremarkable, besides known AS.  The  Chest pain it self is non specific, but she has myriad of concerning constitutional including sob, dizziness, near syncope. She has hx of AS. No classic hx of CHF exacerbation, taking all her meds. Given AS, i will have to get Cardiology involved due to the sx she is describing.     Date: 03/25/2012  Rate: 87  Rhythm: atrial fibrillation  QRS Axis: normal  Intervals: normal  ST/T Wave abnormalities: nonspecific ST/T changes  Conduction Disutrbances:none  Narrative Interpretation:   Old EKG Reviewed: unchanged  Labs showing significant BNP bump, and aacute on chronic kidney disease . Will require admission. Still awaiting Cardiology to call us back.   Derwood Kaplan, MD 03/25/12 [phone]

## 2012-03-25 ENCOUNTER — Inpatient Hospital Stay (HOSPITAL_COMMUNITY): Payer: Medicare Other

## 2012-03-25 ENCOUNTER — Encounter (HOSPITAL_COMMUNITY): Payer: Self-pay

## 2012-03-25 DIAGNOSIS — R079 Chest pain, unspecified: Secondary | ICD-10-CM | POA: Diagnosis present

## 2012-03-25 DIAGNOSIS — N179 Acute kidney failure, unspecified: Principal | ICD-10-CM

## 2012-03-25 DIAGNOSIS — I5032 Chronic diastolic (congestive) heart failure: Secondary | ICD-10-CM | POA: Diagnosis present

## 2012-03-25 DIAGNOSIS — G9340 Encephalopathy, unspecified: Secondary | ICD-10-CM

## 2012-03-25 DIAGNOSIS — R109 Unspecified abdominal pain: Secondary | ICD-10-CM | POA: Diagnosis present

## 2012-03-25 DIAGNOSIS — E875 Hyperkalemia: Secondary | ICD-10-CM | POA: Diagnosis present

## 2012-03-25 DIAGNOSIS — K219 Gastro-esophageal reflux disease without esophagitis: Secondary | ICD-10-CM

## 2012-03-25 LAB — BASIC METABOLIC PANEL
Chloride: 97 mEq/L (ref 96–112)
GFR calc Af Amer: 13 mL/min — ABNORMAL LOW (ref >90)
GFR calc non Af Amer: 11 mL/min — ABNORMAL LOW (ref >90)
Glucose, Bld: 108 mg/dL — ABNORMAL HIGH (ref 70–99)
Potassium: 5.4 mEq/L — ABNORMAL HIGH (ref 3.5–5.1)
Sodium: 134 mEq/L — ABNORMAL LOW (ref 135–145)

## 2012-03-25 LAB — URINE MICROSCOPIC-ADD ON

## 2012-03-25 LAB — URINALYSIS, ROUTINE W REFLEX MICROSCOPIC
Nitrite: POSITIVE — AB
Protein, ur: >300 mg/dL — AB
Urobilinogen, UA: 0.2 mg/dL (ref 0.0–1.0)

## 2012-03-25 LAB — TROPONIN I
Troponin I: <0.3 ng/mL (ref <0.30)
Troponin I: <0.3 ng/mL (ref <0.30)

## 2012-03-25 LAB — COMPREHENSIVE METABOLIC PANEL
ALT: 30 U/L (ref 0–35)
Albumin: 3.1 g/dL — ABNORMAL LOW (ref 3.5–5.2)
Alkaline Phosphatase: 85 U/L (ref 39–117)
Potassium: 6.6 mEq/L (ref 3.5–5.1)
Sodium: 132 mEq/L — ABNORMAL LOW (ref 135–145)
Total Protein: 6.5 g/dL (ref 6.0–8.3)

## 2012-03-25 LAB — CK: Total CK: 102 U/L (ref 7–177)

## 2012-03-25 LAB — HEPATIC FUNCTION PANEL
Alkaline Phosphatase: 72 U/L (ref 39–117)
Indirect Bilirubin: 0.2 mg/dL — ABNORMAL LOW (ref 0.3–0.9)
Total Protein: 6.1 g/dL (ref 6.0–8.3)

## 2012-03-25 LAB — CBC
HCT: 40.2 % (ref 36.0–46.0)
MCHC: 32.6 g/dL (ref 30.0–36.0)
MCV: 96.2 fL (ref 78.0–100.0)
RDW: 14.4 % (ref 11.5–15.5)

## 2012-03-25 LAB — LACTIC ACID, PLASMA: Lactic Acid, Venous: 2.1 mmol/L (ref 0.5–2.2)

## 2012-03-25 LAB — LIPASE, BLOOD
Lipase: 23 U/L (ref 11–59)
Lipase: 28 U/L (ref 11–59)

## 2012-03-25 LAB — SODIUM, URINE, RANDOM: Sodium, Ur: 38 mEq/L

## 2012-03-25 LAB — PROCALCITONIN: Procalcitonin: 0.31 ng/mL

## 2012-03-25 MED ORDER — ACETAMINOPHEN 325 MG PO TABS
650.0000 mg | ORAL_TABLET | Freq: Four times a day (QID) | ORAL | Status: DC | PRN
  Administered 2012-03-25 – 2012-03-26 (×3): 650 mg via ORAL
  Filled 2012-03-25 (×3): qty 2

## 2012-03-25 MED ORDER — DILTIAZEM HCL ER COATED BEADS 240 MG PO CP24
240.0000 mg | ORAL_CAPSULE | Freq: Every day | ORAL | Status: DC
  Administered 2012-03-25: 240 mg via ORAL
  Filled 2012-03-25: qty 1

## 2012-03-25 MED ORDER — PANTOPRAZOLE SODIUM 40 MG IV SOLR
40.0000 mg | INTRAVENOUS | Status: DC
  Filled 2012-03-25: qty 40

## 2012-03-25 MED ORDER — LEVOTHYROXINE SODIUM 100 MCG IV SOLR
25.0000 ug | Freq: Every day | INTRAVENOUS | Status: DC
  Administered 2012-03-27: 26 ug via INTRAVENOUS
  Filled 2012-03-25 (×3): qty 1.3

## 2012-03-25 MED ORDER — BISACODYL 10 MG RE SUPP: 10.0000 mg | Freq: Every day | RECTAL | Status: DC | PRN

## 2012-03-25 MED ORDER — LEVOTHYROXINE SODIUM 88 MCG PO TABS
88.0000 ug | ORAL_TABLET | Freq: Every day | ORAL | Status: DC
  Administered 2012-03-25: 88 ug via ORAL
  Filled 2012-03-25 (×2): qty 1

## 2012-03-25 MED ORDER — ASPIRIN 81 MG PO CHEW
81.0000 mg | CHEWABLE_TABLET | Freq: Every day | ORAL | Status: DC
  Administered 2012-03-25: 81 mg via ORAL
  Filled 2012-03-25: qty 1

## 2012-03-25 MED ORDER — SODIUM CHLORIDE 0.9 % IV SOLN
1.0000 g | Freq: Once | INTRAVENOUS | Status: AC
  Administered 2012-03-25: 1 g via INTRAVENOUS
  Filled 2012-03-25: qty 10

## 2012-03-25 MED ORDER — INSULIN ASPART 100 UNIT/ML ~~LOC~~ SOLN
10.0000 [IU] | Freq: Once | SUBCUTANEOUS | Status: AC
  Administered 2012-03-25: 10 [IU] via SUBCUTANEOUS

## 2012-03-25 MED ORDER — ENSURE COMPLETE PO LIQD
237.0000 mL | Freq: Two times a day (BID) | ORAL | Status: DC
Start: 2012-03-25 — End: 2012-03-25
  Administered 2012-03-25: 237 mL via ORAL

## 2012-03-25 MED ORDER — SODIUM POLYSTYRENE SULFONATE 15 GM/60ML PO SUSP
30.0000 g | Freq: Once | ORAL | Status: AC
  Administered 2012-03-25: 30 g via ORAL
  Filled 2012-03-25 (×2): qty 60

## 2012-03-25 MED ORDER — SODIUM CHLORIDE 0.9 % IV BOLUS (SEPSIS)
1000.0000 mL | Freq: Once | INTRAVENOUS | Status: AC
  Administered 2012-03-25: 1000 mL via INTRAVENOUS

## 2012-03-25 MED ORDER — DEXTROSE 50 % IV SOLN
INTRAVENOUS | Status: AC
  Filled 2012-03-25: qty 50

## 2012-03-25 MED ORDER — SODIUM CHLORIDE 0.9 % IV SOLN
INTRAVENOUS | Status: DC
  Administered 2012-03-25: 17:00:00 via INTRAVENOUS

## 2012-03-25 MED ORDER — SODIUM CHLORIDE 0.9 % IJ SOLN
3.0000 mL | Freq: Two times a day (BID) | INTRAMUSCULAR | Status: DC
  Administered 2012-03-25 – 2012-03-27 (×2): 3 mL via INTRAVENOUS

## 2012-03-25 MED ORDER — ATORVASTATIN CALCIUM 40 MG PO TABS
40.0000 mg | ORAL_TABLET | Freq: Every day | ORAL | Status: DC
  Filled 2012-03-25 (×2): qty 1

## 2012-03-25 MED ORDER — INFLUENZA VIRUS VACC SPLIT PF IM SUSP
0.5000 mL | INTRAMUSCULAR | Status: DC
  Filled 2012-03-25: qty 0.5

## 2012-03-25 MED ORDER — IOHEXOL 300 MG/ML  SOLN: 20.0000 mL | INTRAMUSCULAR | Status: AC

## 2012-03-25 MED ORDER — ONDANSETRON HCL 4 MG/2ML IJ SOLN
4.0000 mg | Freq: Four times a day (QID) | INTRAMUSCULAR | Status: DC | PRN
  Administered 2012-03-25 – 2012-03-26 (×2): 4 mg via INTRAVENOUS
  Filled 2012-03-25 (×2): qty 2

## 2012-03-25 MED ORDER — DEXTROSE 50 % IV SOLN
1.0000 | Freq: Once | INTRAVENOUS | Status: AC
  Administered 2012-03-25: 50 mL via INTRAVENOUS
  Filled 2012-03-25: qty 50

## 2012-03-25 MED ORDER — POLYETHYLENE GLYCOL 3350 17 G PO PACK
17.0000 g | PACK | Freq: Every day | ORAL | Status: DC
Start: 2012-03-25 — End: 2012-03-25
  Administered 2012-03-25: 17 g via ORAL
  Filled 2012-03-25 (×2): qty 1

## 2012-03-25 MED ORDER — ONDANSETRON HCL 4 MG/2ML IJ SOLN: 4.0000 mg | Freq: Three times a day (TID) | INTRAMUSCULAR | Status: DC | PRN

## 2012-03-25 MED ORDER — METOPROLOL SUCCINATE ER 100 MG PO TB24
100.0000 mg | ORAL_TABLET | Freq: Two times a day (BID) | ORAL | Status: DC
  Filled 2012-03-25 (×3): qty 1

## 2012-03-25 MED ORDER — PNEUMOCOCCAL VAC POLYVALENT 25 MCG/0.5ML IJ INJ
0.5000 mL | INJECTION | INTRAMUSCULAR | Status: DC
  Filled 2012-03-25: qty 0.5

## 2012-03-25 MED ORDER — DEXTROSE 5 % IV SOLN
1.0000 g | INTRAVENOUS | Status: DC
  Administered 2012-03-25: 1 g via INTRAVENOUS
  Filled 2012-03-25 (×2): qty 10

## 2012-03-25 MED ORDER — FUROSEMIDE 10 MG/ML IJ SOLN: 60.0000 mg | Freq: Once | INTRAMUSCULAR | Status: DC

## 2012-03-25 MED ORDER — ONDANSETRON HCL 4 MG PO TABS: 4.0000 mg | ORAL_TABLET | Freq: Four times a day (QID) | ORAL | Status: DC | PRN

## 2012-03-25 MED ORDER — SODIUM CHLORIDE 0.9 % IV SOLN
INTRAVENOUS | Status: AC
  Administered 2012-03-25 (×2): via INTRAVENOUS

## 2012-03-25 MED ORDER — PANTOPRAZOLE SODIUM 40 MG PO TBEC
40.0000 mg | DELAYED_RELEASE_TABLET | Freq: Two times a day (BID) | ORAL | Status: DC
  Administered 2012-03-25: 40 mg via ORAL
  Filled 2012-03-25: qty 1

## 2012-03-25 NOTE — Progress Notes (Signed)
INITIAL ADULT NUTRITION ASSESSMENT Date: 03/25/2012   Time: 10:53 AM Reason for Assessment: MST  INTERVENTION: 1.  Supplements; Ensure Complete, VANILLA ONLY, BID between meals.   DOCUMENTATION CODES Per approved criteria  -Not Applicable   Loyce Dys, MS RD LDN Clinical Inpatient Dietitian Pager: [phone] Weekend/After hours pager: [phone]  ASSESSMENT: Female 78 y.o.  Dx: headache  Hx:  Past Medical History  Diagnosis Date  . Permanent atrial fibrillation   . Mitral valve prolapse   . Sinus bradycardia-tachycardia syndrome   . Hemiplegia affecting unspecified side, late effect of cerebrovascular disease   . Hypertension   . Malignant neoplasm of kidney, except pelvis     s/p L nephrectomy  . Hyperlipidemia   . Osteoarthrosis, unspecified whether generalized or localized, unspecified site   . Peptic ulcer, unspecified site, unspecified as acute or chronic, without mention of hemorrhage, perforation, or obstruction   . Hypothyroidism   . Intracranial bleed     h/o right parietal bleed; not a coumadin candidate  . Aortic stenosis     a. echo 1/12: Ef 55-60%, mod LVH, grade 1 diast dysfxn, mod to severe AS with AVA 0.94 (Vmax) and mean gradient 23 mmHg, PASP 33;   b. echo 6/12: mod LVH, EF 65%, mod AS with mean gradient 28 mmHg, mild to mod MR, mild RAE, no change since 1/12  . Chest pain     Myoview 6/12: no ischemia, EF 64%   Past Surgical History  Procedure Date  . Tonsillectomy   . Back surgery     degenerative joint disease    Related Meds:  Scheduled Meds:   . aspirin  162 mg Oral Once  . aspirin  81 mg Oral Daily  . atorvastatin  40 mg Oral q1800  . diltiazem  240 mg Oral Daily  . famotidine (PEPCID) IV  20 mg Intravenous Once  . influenza  inactive virus vaccine  0.5 mL Intramuscular Tomorrow-1000  . levothyroxine  88 mcg Oral Q0600  . metoprolol succinate  100 mg Oral BID  . pantoprazole  40 mg Oral BID AC  . pneumococcal 23 valent vaccine  0.5  mL Intramuscular Tomorrow-1000  . polyethylene glycol  17 g Oral Daily  . sodium chloride  3 mL Intravenous Q12H  . DISCONTD: furosemide  60 mg Intravenous Once   Continuous Infusions:   . sodium chloride 125 mL/hr at 03/25/12 1025   PRN Meds:.acetaminophen, bisacodyl, ondansetron (ZOFRAN) IV, ondansetron, DISCONTD: ondansetron (ZOFRAN) IV   Ht: 5\' 3"  (160 cm)  Wt: 139 lb 8.8 oz (63.3 kg)  Ideal Wt: 52.2 % Ideal Wt: 121%  Usual Wt:  Wt Readings from Last 10 Encounters:  03/25/12 139 lb 8.8 oz (63.3 kg)  01/20/12 138 lb (62.596 kg)  05/16/11 145 lb 6.4 oz (65.953 kg)  01/20/11 147 lb (66.679 kg)  12/22/10 147 lb (66.679 kg)  12/09/10 147 lb 12.8 oz (67.042 kg)  11/11/10 147 lb 1.9 oz (66.733 kg)  08/28/10 150 lb 4 oz (68.153 kg)   % Usual Wt: 95%  Body mass index is 24.72 kg/(m^2).  Food/Nutrition Related Hx: nausea, vomiting, decreased intake PTA  Labs:  CMP     Component Value Date/Time   NA 134* 03/25/2012 0710   K 5.4* 03/25/2012 0710   CL 97 03/25/2012 0710   CO2 27 03/25/2012 0710   GLUCOSE 108* 03/25/2012 0710   BUN 37* 03/25/2012 0710   CREATININE 3.55* 03/25/2012 0710   CALCIUM 9.2 03/25/2012 0710   PROT  6.1 03/25/2012 0252   ALBUMIN 3.0* 03/25/2012 0252   AST 56* 03/25/2012 0252   ALT 19 03/25/2012 0252   ALKPHOS 72 03/25/2012 0252   BILITOT 0.4 03/25/2012 0252   GFRNONAA 11* 03/25/2012 0710   GFRAA 13* 03/25/2012 0710   Intake: no meal yet Output:   Intake/Output Summary (Last 24 hours) at 03/25/12 1055 Last data filed at 03/25/12 0546  Gross per 24 hour  Intake      0 ml  Output     50 ml  Net    -50 ml   Last BM (9/11)  Diet Order: Full Liquid  Supplements/Tube Feeding: none at this time  IVF:    sodium chloride Last Rate: 125 mL/hr at 03/25/12 1025    Estimated Nutritional Needs:   Kcal: 1580-1770 Protein: 63-75g Fluid: ~1.8 L/day  Pt sleeping soundly at time of visit, does not awaken to voice x2.  Staff outside of room states that pt  has slept through several visits this am.   Chart review reveals pt with slow progressive wt loss over the last 1.5 years.  Pt has lost approximately 5% in 1 year which is not clinically significant but concerning given hx reported on admission of N/V, decreased intake for several days, and recent dx of dyspepsia/GER.  RD unable to dx with malnutrition at this time, however some level of undernourishment suspected.   NUTRITION DIAGNOSIS: -Inadequate oral intake (NI-2.1).  Status: Ongoing  RELATED TO: omission of energy dense foods  AS EVIDENCE BY: pt on unsupplemented full liquid diet  MONITORING/EVALUATION(Goals): 1. Food/Beverage; diet advancement with tolerance, pt to consume >50% of meals and supplements.  EDUCATION NEEDS: -Education not appropriate at this time

## 2012-03-25 NOTE — Progress Notes (Signed)
  Echocardiogram 2D Echocardiogram has been performed.  Georgian Co 03/25/2012, 9:59 AM

## 2012-03-25 NOTE — Progress Notes (Signed)
Bladder scan completed per MD order, 12 ml present . MD aware and at bedside. Will continue to monitor patient closely

## 2012-03-25 NOTE — ED Notes (Signed)
The pt has no complaints 

## 2012-03-25 NOTE — ED Notes (Signed)
Admitting doctor in to see 

## 2012-03-25 NOTE — Consult Note (Signed)
[facility] Medical Consultation  [name] VHQ:[id number] DOB: 01-29-1933 DOA: 03/24/2012 PCP: Michiel Sites, MD   Requesting physician: Donato Schultz  Date of consultation: 03/25/12 Reason for consultation: AMS  Impression/Recommendations Principal Problem:  *Acute renal failure Active Problems:  RENAL CELL CANCER  HYPOTHYROIDISM  HYPERLIPIDEMIA  HYPERTENSION  Atrial fibrillation  CEREBROVASCULAR ACCIDENT WITH RIGHT HEMIPARESIS  PUD  Aortic stenosis  GERD (gastroesophageal reflux disease)  Nausea and vomiting in adult  Chest pain  Chronic diastolic heart failure  Hyperkalemia  Encephalopathy acute  Abdominal pain    1. Altered mental status-developed throughout the day-most likely due to toxic metabolic encephalopathy. There is history of stroke but I doubt the patient had seizures since she was closely monitored by nursing staff and daughter at bedside and no seizure was witnessed. She also has no focal neurological deficits to suggest a new stroke. I do suspect a metabolic encephalopathy is related to anuria and probable infectious process.  2. Acute renal failure with anuria since admission.  [facility] Dr. Arlean Hopping has seen the patient. Unfortunately at this point in time the patient has received adequate IV fluids and I do not think we can bolus her more. We will place IV fluids KVO, continue to monitor input and output, obtain a stat portable chest x-ray. The patient's daughter reports that the patient would not desire hemodialysis. Patient has only one kidney. A renal ultrasound is negative for hydronephrosis. We will obtain a CK level given the fact that the patient was on high dose Crestor, to rule out rhabdomyolysis.  3. Severe abdominal pain-unclear etiology-patient has never had any major abdominal surgery. Liver function testing and lipase are within normal limits. She could still have significant abdominal pathology including mesenteric  ischemia, perforated viscus, AAA. I doubt she has cholecystitis or diverticulitis because she doesn't have leukocytosis. We'll obtain a stat CT scan of abdomen and pelvis, repeat CMET,  CBC and lactic acid. 4. History of chronic diastolic congestive heart failure- echocardiogram from September 14 does not reveal any new worrisome findings. 5. Hyperkalemia-due to acute renal failure and ACE inhibitor use as an outpatient. Monitor closely repeat CMP 6. I have discussed with the patient's daughter present at the bedside findings and plan of care. Based on patient's prior expressed wishes, the daughter has requested DO NOT RESUSCITATE status and not to initiate dialysis.   Will take over on our service until the situation clarifies.  Chief Complaint: abdominal pain   HPI:  78 year old woman who presents to the emergency room with complaints of not feeling well pain in the chest and abdomen.  Review of Systems:  Currently the patient is obtunded and is unable to provide review of systems  Past Medical History  Diagnosis Date  . Permanent atrial fibrillation   . Mitral valve prolapse   . Sinus bradycardia-tachycardia syndrome   . Hemiplegia affecting unspecified side, late effect of cerebrovascular disease   . Hypertension   . Malignant neoplasm of kidney, except pelvis     s/p L nephrectomy  . Hyperlipidemia   . Osteoarthrosis, unspecified whether generalized or localized, unspecified site   . Peptic ulcer, unspecified site, unspecified as acute or chronic, without mention of hemorrhage, perforation, or obstruction   . Hypothyroidism   . Intracranial bleed     h/o right parietal bleed; not a coumadin candidate  . Aortic stenosis     a. echo 1/12: Ef 55-60%, mod LVH, grade 1 diast dysfxn, mod to severe AS with AVA 0.94 (Vmax)  and mean gradient 23 mmHg, PASP 33;   b. echo 6/12: mod LVH, EF 65%, mod AS with mean gradient 28 mmHg, mild to mod MR, mild RAE, no change since 1/12  . Chest pain       Myoview 6/12: no ischemia, EF 64%   Past Surgical History  Procedure Date  . Tonsillectomy   . Back surgery     degenerative joint disease  . Nephrectomy    Social History:  reports that she has never smoked. She does not have any smokeless tobacco history on file. She reports that she does not drink alcohol or use illicit drugs.  Allergies  Allergen Reactions  . Codeine Other (See Comments)    Reaction unknown  . Hydrocodone Other (See Comments)    Unknown reaction   . Plavix (Clopidogrel Bisulfate)   . Warfarin And Related    Family History  Problem Relation Age of Onset  . Transient ischemic attack Mother   . Heart disease Father   . Arrhythmia Sister   . Arrhythmia Brother     Prior to Admission medications   Medication Sig Start Date End Date Taking? Authorizing Provider  amitriptyline (ELAVIL) 25 MG tablet Take 1 tablet (25 mg total) by mouth at bedtime. 05/18/11  Yes Hollice Espy, MD  aspirin 81 MG tablet Take 81 mg by mouth every evening.    Yes Historical Provider, MD  CARTIA XT 240 MG 24 hr capsule TAKE ONE CAPSULE BY MOUTH DAILY 03/24/11  Yes Gaylord Shih, MD  diclofenac sodium (VOLTAREN) 1 % GEL Apply 2 g topically daily as needed. For pain   Yes Historical Provider, MD  levothyroxine (SYNTHROID, LEVOTHROID) 88 MCG tablet Take 88 mcg by mouth daily.    Yes Historical Provider, MD  lisinopril (PRINIVIL,ZESTRIL) 40 MG tablet Take 40 mg by mouth every evening.    Yes Historical Provider, MD  metoprolol (TOPROL-XL) 100 MG 24 hr tablet Take 100 mg by mouth 2 (two) times daily.    Yes Historical Provider, MD  pantoprazole (PROTONIX) 40 MG tablet TAKE ONE TABLET BY MOUTH DAILY 05/12/11  Yes Gaylord Shih, MD  promethazine (PHENERGAN) 12.5 MG tablet Take 12.5 mg by mouth every 6 (six) hours as needed. For nausea   Yes Historical Provider, MD  rosuvastatin (CRESTOR) 40 MG tablet Take 20 mg by mouth daily.    Yes Historical Provider, MD  traMADol (ULTRAM) 50 MG  tablet Take 50 mg by mouth every 6 (six) hours as needed. For pain. Maximum dose= 8 tablets per day    Yes Historical Provider, MD  promethazine (PHENERGAN) 12.5 MG tablet Take 0.5 tablets (6.25 mg total) by mouth every 6 (six) hours as needed for nausea. 05/18/11 05/25/11  Hollice Espy, MD   Physical Exam: Blood pressure 142/91, pulse 62, temperature 97 F (36.1 C), temperature source Oral, resp. rate 20, height 5\' 3"  (1.6 m), weight 63.3 kg (139 lb 8.8 oz), SpO2 94.00%. Filed Vitals:   03/25/12 1144 03/25/12 1439 03/25/12 1537 03/25/12 1628  BP: 127/103 146/94 130/84 142/91  Pulse: 62 85  62  Temp: 98.2 F (36.8 C) 98 F (36.7 C)  97 F (36.1 C)  TempSrc: Axillary Oral  Oral  Resp: 18 18 20 20   Height:      Weight:      SpO2: 99% 94%  94%     General:  Arousable for a few seconds and then falls immediately asleep able to move her extremities can follow  one-step commands  Eyes: When she opens her eyes during the midline the pupils are about 2 mm and symmetric  ENT: Dry oral mucosa  Neck: No JVD  Cardiovascular: Atrial flutter with variable ventricular response  Respiratory: Rhonchi bilaterally, no wheezes  Abdomen: Diffuse tenderness, quiet, no guarding  Skin: Pale, cold clammy extremities  Musculoskeletal: No significant deformities  Psychiatric: Unable to fully examine  Neurologic: Obtunded, able to move all 4 extremities  Labs on Admission:  Basic Metabolic Panel:  Lab 03/25/12 1610 03/24/12 2149  NA 134* 134*  K 5.4* 5.5*  CL 97 96  CO2 27 28  GLUCOSE 108* 110*  BUN 37* 34*  CREATININE 3.55* 3.05*  CALCIUM 9.2 9.5  MG -- --  PHOS -- --   Liver Function Tests:  Lab 03/25/12 0252  AST 56*  ALT 19  ALKPHOS 72  BILITOT 0.4  PROT 6.1  ALBUMIN 3.0*    Lab 03/25/12 0252  LIPASE 23  AMYLASE --   No results found for this basename: AMMONIA:5 in the last 168 hours CBC:  Lab 03/24/12 2149  WBC 10.4  NEUTROABS 8.8*  HGB 12.8  HCT 38.2    MCV 93.6  PLT 161   Cardiac Enzymes:  Lab 03/25/12 1629 03/25/12 1015 03/25/12 0710 03/24/12 2149  CKTOTAL -- -- -- --  CKMB -- -- -- --  CKMBINDEX -- -- -- --  TROPONINI <0.30 <0.30 <0.30 <0.30   BNP: No components found with this basename: POCBNP:5 CBG:  Lab 03/25/12 1712  GLUCAP 150*    Radiological Exams on Admission: Ct Head Wo Contrast  03/25/2012  *RADIOLOGY REPORT*  Clinical Data: Headache  CT HEAD WITHOUT CONTRAST  Technique:  Contiguous axial images were obtained from the base of the skull through the vertex without contrast.  Comparison: 03/26/2011  Findings: Several slices are mildly degraded by patient motion/streak artifact.  Allowing for this, periventricular and subcortical white matter hypodensities are most in keeping with chronic microangiopathic change.  Atherosclerotic vascular calcifications.  No hydrocephalous.  No definite intraparenchymal hemorrhage, mass, mass effect, or abnormal extra-axial fluid collection.  No definite CT evidence of acute infarction. The visualized paranasal sinuses and mastoid air cells are predominately clear. Left maxillary fibrous dysplasia again noted.  IMPRESSION: White matter changes.  No definite acute intracranial abnormality.   Original Report Authenticated By: Waneta Martins, M.D.    US Renal  03/25/2012  *RADIOLOGY REPORT*  Clinical Data: Acute renal failure, history of left nephrectomy.  RENAL/URINARY TRACT ULTRASOUND COMPLETE  Comparison:  None.  Findings:  Right Kidney:  Measures 10.5 cm.  Normal echogenicity.  No hydronephrosis.  Left Kidney:  Absent  Bladder:  Decompressed  IMPRESSION: Normal sonographic appearance to the right kidney.  Status post left nephrectomy.   Original Report Authenticated By: Waneta Martins, M.D.    Dg Chest Port 1 View  03/24/2012  *RADIOLOGY REPORT*  Clinical Data: Arrhythmia.  History of hypertension.  PORTABLE CHEST - 1 VIEW 03/24/2012 2201 hours:  Comparison: Two-view chest x-ray  05/16/2011.  Findings: Suboptimal inspiration accounts for crowded bronchovascular markings, especially in the lung bases, and accentuates the cardiac silhouette.  Taking this into account, cardiac silhouette normal in size for AP portable technique. Chronic pleuroparenchymal scarring at the left base with blunting of the left costophrenic angle.  Lungs otherwise clear.  IMPRESSION: No acute cardiopulmonary disease.  Chronic pleuroparenchymal scarring at the left base.   Original Report Authenticated By: Arnell Sieving, M.D.     EKG:  Independently reviewed. Atrial flutter with variable ventricular response  Time spent: One hour  Yong Grieser [facility] Pager [phone]  If 7PM-7AM, please contact night-coverage [email or web address] Password [facility] 03/25/2012, 6:57 PM

## 2012-03-25 NOTE — Consult Note (Signed)
[facility] CONSULT NOTE    Date: 03/25/2012                  Patient Name:  [name]  MRN: [id number]  DOB: Apr 22, 1933  Age / Sex: 78 y.o., female         PCP: Michiel Sites, MD                 Service Requesting Consult: Primary Team                 Reason for Consult: ARF            History of Present Illness: Patient is a 78 y.o. female with a PMHx of mitral stenosis, A fib, L nephrectomy, who was admitted to [facility] on 03/24/2012 for evaluation of nausea and vomiting with intermittent chest pain for several days. She has been having increased frequency for some time and some discomfort while urinating over the last week. 2-3 days prior to admission she developed nausea and vomiting. She continued to take her blood pressure medication during this time. She then presented to the ED for intermittent chest pain which comes and goes and is worse post emesis. No fevers or chills at home. She is status post L nephrectomy for cancer.    Medications: Outpatient medications: Prescriptions prior to admission  Medication Sig Dispense Refill  . amitriptyline (ELAVIL) 25 MG tablet Take 1 tablet (25 mg total) by mouth at bedtime.      Marland Kitchen aspirin 81 MG tablet Take 81 mg by mouth every evening.       Marland Kitchen CARTIA XT 240 MG 24 hr capsule TAKE ONE CAPSULE BY MOUTH DAILY  30 capsule  5  . diclofenac sodium (VOLTAREN) 1 % GEL Apply 2 g topically daily as needed. For pain      . levothyroxine (SYNTHROID, LEVOTHROID) 88 MCG tablet Take 88 mcg by mouth daily.       Marland Kitchen lisinopril (PRINIVIL,ZESTRIL) 40 MG tablet Take 40 mg by mouth every evening.       . metoprolol (TOPROL-XL) 100 MG 24 hr tablet Take 100 mg by mouth 2 (two) times daily.       . pantoprazole (PROTONIX) 40 MG tablet TAKE ONE TABLET BY MOUTH DAILY  30 tablet  11  . promethazine (PHENERGAN) 12.5 MG tablet Take 12.5 mg by mouth every 6 (six) hours as needed. For nausea      . rosuvastatin (CRESTOR) 40 MG tablet Take 20 mg by  mouth daily.       . traMADol (ULTRAM) 50 MG tablet Take 50 mg by mouth every 6 (six) hours as needed. For pain. Maximum dose= 8 tablets per day       . promethazine (PHENERGAN) 12.5 MG tablet Take 0.5 tablets (6.25 mg total) by mouth every 6 (six) hours as needed for nausea.  30 tablet  0    Current medications: Current Facility-Administered Medications  Medication Dose Route Frequency Provider Last Rate Last Dose  . 0.9 %  sodium chloride infusion   Intravenous Continuous Seward Speck, MD 125 mL/hr at 03/25/12 1025    . acetaminophen (TYLENOL) tablet 650 mg  650 mg Oral Q6H PRN Seward Speck, MD   650 mg at 03/25/12 0617  . aspirin chewable tablet 162 mg  162 mg Oral Once Derwood Kaplan, MD   162 mg at 03/24/12 2159  . aspirin chewable tablet 81 mg  81 mg Oral Daily Seward Speck, MD  81 mg at 03/25/12 1145  . atorvastatin (LIPITOR) tablet 40 mg  40 mg Oral q1800 Seward Speck, MD      . bisacodyl (DULCOLAX) suppository 10 mg  10 mg Rectal Daily PRN Seward Speck, MD      . famotidine (PEPCID) IVPB 20 mg  20 mg Intravenous Once Derwood Kaplan, MD   20 mg at 03/24/12 2312  . feeding supplement (ENSURE COMPLETE) liquid 237 mL  237 mL Oral BID BM Ashley Jacobs, RD      . influenza  inactive virus vaccine (FLUZONE/FLUARIX) injection 0.5 mL  0.5 mL Intramuscular Tomorrow-1000 Seward Speck, MD      . levothyroxine (SYNTHROID, LEVOTHROID) tablet 88 mcg  88 mcg Oral Q0600 Seward Speck, MD   88 mcg at 03/25/12 0865  . metoprolol succinate (TOPROL-XL) 24 hr tablet 100 mg  100 mg Oral BID Seward Speck, MD      . ondansetron [facility]) tablet 4 mg  4 mg Oral Q6H PRN Seward Speck, MD       Or  . ondansetron [facility]) injection 4 mg  4 mg Intravenous Q6H PRN Seward Speck, MD   4 mg at 03/25/12 0546  . pantoprazole (PROTONIX) EC tablet 40 mg  40 mg Oral BID AC Seward Speck, MD   40 mg at 03/25/12 1145  . pneumococcal 23 valent vaccine (PNU-IMMUNE) injection 0.5 mL  0.5 mL Intramuscular Tomorrow-1000 Seward Speck, MD        . polyethylene glycol (MIRALAX / GLYCOLAX) packet 17 g  17 g Oral Daily Seward Speck, MD      . sodium chloride 0.9 % injection 3 mL  3 mL Intravenous Q12H Seward Speck, MD      . DISCONTD: diltiazem (CARDIZEM CD) 24 hr capsule 240 mg  240 mg Oral Daily Seward Speck, MD   240 mg at 03/25/12 1210  . DISCONTD: furosemide (LASIX) injection 60 mg  60 mg Intravenous Once Derwood Kaplan, MD      . DISCONTD: ondansetron (ZOFRAN) injection 4 mg  4 mg Intravenous Q8H PRN Derwood Kaplan, MD          Allergies: Allergies  Allergen Reactions  . Codeine Other (See Comments)    Reaction unknown  . Hydrocodone Other (See Comments)    Unknown reaction   . Plavix (Clopidogrel Bisulfate)   . Warfarin And Related       Past Medical History: Past Medical History  Diagnosis Date  . Permanent atrial fibrillation   . Mitral valve prolapse   . Sinus bradycardia-tachycardia syndrome   . Hemiplegia affecting unspecified side, late effect of cerebrovascular disease   . Hypertension   . Malignant neoplasm of kidney, except pelvis     s/p L nephrectomy  . Hyperlipidemia   . Osteoarthrosis, unspecified whether generalized or localized, unspecified site   . Peptic ulcer, unspecified site, unspecified as acute or chronic, without mention of hemorrhage, perforation, or obstruction   . Hypothyroidism   . Intracranial bleed     h/o right parietal bleed; not a coumadin candidate  . Aortic stenosis     a. echo 1/12: Ef 55-60%, mod LVH, grade 1 diast dysfxn, mod to severe AS with AVA 0.94 (Vmax) and mean gradient 23 mmHg, PASP 33;   b. echo 6/12: mod LVH, EF 65%, mod AS with mean gradient 28 mmHg, mild to mod MR, mild RAE, no change since 1/12  . Chest pain     Myoview 6/12: no ischemia, EF 64%  Past Surgical History: Past Surgical History  Procedure Date  . Tonsillectomy   . Back surgery     degenerative joint disease     Family History: Family History  Problem Relation Age of Onset  . Transient  ischemic attack Mother   . Heart disease Father   . Arrhythmia Sister   . Arrhythmia Brother      Social History: History   Social History  . Marital Status: Widowed    Spouse Name: N/A    Number of Children: N/A  . Years of Education: N/A   Occupational History  . Not on file.   Social History Main Topics  . Smoking status: Never Smoker   . Smokeless tobacco: Not on file  . Alcohol Use: No  . Drug Use: No  . Sexually Active: Not on file   Other Topics Concern  . Not on file   Social History Narrative  . No narrative on file     Review of Systems: As per HPI  Vital Signs: Blood pressure 127/103, pulse 62, temperature 98.2 F (36.8 C), temperature source Axillary, resp. rate 18, height 5\' 3"  (1.6 m), weight 139 lb 8.8 oz (63.3 kg), SpO2 99.00%.  Weight trends: Filed Weights   03/25/12 0439  Weight: 139 lb 8.8 oz (63.3 kg)    Physical Exam: General: resting in bed, sleepy but arousable HEENT: PERRL, EOMI, no scleral icterus Cardiac: irregularly irregular rhythmn Pulm: clear to auscultation bilaterally, moving normal volumes of air Abd: soft, nontender, nondistended, BS present, some flank pain  Ext: warm and well perfused, no pedal edema Neuro: alert and oriented X3, cranial nerves II-XII grossly intact  Lab results: Basic Metabolic Panel:  Lab 03/25/12 1610 03/24/12 2149  NA 134* 134*  K 5.4* 5.5*  CL 97 96  CO2 27 28  GLUCOSE 108* 110*  BUN 37* 34*  CREATININE 3.55* 3.05*  CALCIUM 9.2 9.5  MG -- --  PHOS -- --    Liver Function Tests:  Lab 03/25/12 0252  AST 56*  ALT 19  ALKPHOS 72  BILITOT 0.4  PROT 6.1  ALBUMIN 3.0*    Lab 03/25/12 0252  LIPASE 23  AMYLASE --   CBC:  Lab 03/24/12 2149  WBC 10.4  NEUTROABS 8.8*  HGB 12.8  HCT 38.2  MCV 93.6  PLT 161    Cardiac Enzymes:  Lab 03/25/12 1015 03/25/12 0710 03/24/12 2149  CKTOTAL -- -- --  CKMB -- -- --  CKMBINDEX -- -- --  TROPONINI <0.30 <0.30 <0.30    Microbiology: Results for orders placed during the hospital encounter of 05/15/11  URINE CULTURE     Status: Normal   Collection Time   05/15/11  9:09 AM      Component Value Range Status Comment   Specimen Description URINE, CATHETERIZED   Final    Special Requests NONE   Final    Culture  Setup Time [id number]   Final    Colony Count NO GROWTH   Final    Culture NO GROWTH   Final    Report Status 05/16/2011 FINAL   Final    Urinalysis:  Basename 03/25/12 0446  COLORURINE YELLOW  LABSPEC 1.025  PHURINE 6.5  GLUCOSEU NEGATIVE  HGBUR LARGE*  BILIRUBINUR NEGATIVE  KETONESUR NEGATIVE  PROTEINUR >300*  UROBILINOGEN 0.2  NITRITE POSITIVE*  LEUKOCYTESUR MODERATE*      Imaging: Ct Head Wo Contrast  03/25/2012  *RADIOLOGY REPORT*  Clinical Data: Headache  CT HEAD WITHOUT CONTRAST  Technique:  Contiguous axial images were obtained from the base of the skull through the vertex without contrast.  Comparison: 03/26/2011  Findings: Several slices are mildly degraded by patient motion/streak artifact.  Allowing for this, periventricular and subcortical white matter hypodensities are most in keeping with chronic microangiopathic change.  Atherosclerotic vascular calcifications.  No hydrocephalous.  No definite intraparenchymal hemorrhage, mass, mass effect, or abnormal extra-axial fluid collection.  No definite CT evidence of acute infarction. The visualized paranasal sinuses and mastoid air cells are predominately clear. Left maxillary fibrous dysplasia again noted.  IMPRESSION: White matter changes.  No definite acute intracranial abnormality.   Original Report Authenticated By: Waneta Martins, M.D.    US Renal  03/25/2012  *RADIOLOGY REPORT*  Clinical Data: Acute renal failure, history of left nephrectomy.  RENAL/URINARY TRACT ULTRASOUND COMPLETE  Comparison:  None.  Findings:  Right Kidney:  Measures 10.5 cm.  Normal echogenicity.  No hydronephrosis.  Left Kidney:  Absent  Bladder:   Decompressed  IMPRESSION: Normal sonographic appearance to the right kidney.  Status post left nephrectomy.   Original Report Authenticated By: Waneta Martins, M.D.    Dg Chest Port 1 View  03/24/2012  *RADIOLOGY REPORT*  Clinical Data: Arrhythmia.  History of hypertension.  PORTABLE CHEST - 1 VIEW 03/24/2012 2201 hours:  Comparison: Two-view chest x-ray 05/16/2011.  Findings: Suboptimal inspiration accounts for crowded bronchovascular markings, especially in the lung bases, and accentuates the cardiac silhouette.  Taking this into account, cardiac silhouette normal in size for AP portable technique. Chronic pleuroparenchymal scarring at the left base with blunting of the left costophrenic angle.  Lungs otherwise clear.  IMPRESSION: No acute cardiopulmonary disease.  Chronic pleuroparenchymal scarring at the left base.   Original Report Authenticated By: Arnell Sieving, M.D.       Assessment & Plan: Pt is a 78 y.o. yo female with a PMHX of A Fib, mitral stenosis, L nephrectomy, was admitted to [facility] on 03/24/2012 with N/V for several days and intermittent chest pain.   Acute renal failure - Baseline Cr appears to be around 1 about 1 year ago. Patient was taking her meds while having poor to no PO intake and FeNa is 1.53%. Has only had about 10 hours of fluids at this time. Will be cautious with fluids as she has diastolic CHF and do not want to precipitate exacerbation. No contrast exposure prior to now and would not use contrast in her. Accounted for protein. -Bladder scan ordered -Continue fluids at 150 cc/hr NS -1 L normal saline bolus -Insert catheter for I and Os -Treat UTI -Continue to hold ACE-I -Strict I and Os  -Daily weights -SPEP/UPEP -protein to creatinine ratio of urine  Hyperkalemia - K is 5.4 and will continue to watch on telemetry. Would avoid kayexylate at this time since she already has had problems with GI loss. Could be from ACE-I. -Continue fluids as above -Follow  BMP closely  UTI - many bacteria and nitrites and leukocyte esterase on U/A but no treatment initiated. Will start rocephin for UTI. Add-on urine culture to this morning's U/A. -Rocephin 1 g q 24 hours -Follow culture  Chest pain - Per cardiology and likely non-cardiac etiology. Started protonix.   Atrial fibrillation - In A Fib, on metoprolol for rate control and HR 62 on ASA for anti-coagulation. Per cardiology.  Chronic diastolic heart failure - BNP elevated on admission could be related to acute renal failure rather than CHF. Will continue to follow clinically does not  appear to be in exacerbation at this time.  DVT PPX - SCDs  Genella Mech 3:35 PM 03/25/2012 PGY-2  Patient seen and examined with Dr. Dorise Hiss, and agree with assessment and plan as above. [name] with AMS, new renal failure, poor po intake, probable UTI and poor UOP.  She looks dry on exam currently.  She has abnormal UA which could represent UTI or less likely, glomerulonephritis. Will increase fluids, place foley, treat UTI and consider further renal testing if she does not improve. ACEI is being held. She has only one kidney after L neprhectomy for RCC in 2001.  Will follow.  Vinson Moselle  MD [facility] [phone] pgr    [phone] cell 03/25/2012, 3:38 PM

## 2012-03-25 NOTE — Progress Notes (Signed)
Foley catheter inserted per MD order and protocol. No urine return noted. MD aware and present at bedside. Bladder scan completed and 0 ml output present. Orders given and initiated for fluids, see order management. Will continue to monitor pt.

## 2012-03-25 NOTE — Progress Notes (Signed)
Attending MD and nephrology  paged and aware pt's fluid bolus complete with no urine output. Pt is diaphoretic with shallow breathing and continued confusion. VS taken, see doc flow sheet.  EKG completed per attending MD request. Attending MD to come see pt shortly. Will continue to monitor pt.

## 2012-03-25 NOTE — Progress Notes (Signed)
Hyperkalemia   The patient did not receive kayexalate so far. We will repeat the BMP and EKG stat to assess for ST changes; bicarb, insulin/D50 and calcium already given; could repeat if EKG signs of hyperkalemia.

## 2012-03-25 NOTE — ED Notes (Signed)
pts daughter has gone home

## 2012-03-25 NOTE — Progress Notes (Signed)
Bladder scan completed per MD order. 0 mL present. MD made aware. Will continue to monitor.

## 2012-03-25 NOTE — H&P (Signed)
Patient ID: [name] MRN: [id number], DOB/AGE: 1932/12/10   Admit date: 03/24/2012   Primary Physician: Michiel Sites, MD Primary Cardiologist: Dr. Valera Castle  Problem List  Past Medical History  Diagnosis Date  . Permanent atrial fibrillation   . Mitral valve prolapse   . Sinus bradycardia-tachycardia syndrome   . Hemiplegia affecting unspecified side, late effect of cerebrovascular disease   . Hypertension   . Malignant neoplasm of kidney, except pelvis     s/p L nephrectomy  . Hyperlipidemia   . Osteoarthrosis, unspecified whether generalized or localized, unspecified site   . Peptic ulcer, unspecified site, unspecified as acute or chronic, without mention of hemorrhage, perforation, or obstruction   . Hypothyroidism   . Intracranial bleed     h/o right parietal bleed; not a coumadin candidate  . Aortic stenosis     a. echo 1/12: Ef 55-60%, mod LVH, grade 1 diast dysfxn, mod to severe AS with AVA 0.94 (Vmax) and mean gradient 23 mmHg, PASP 33;   b. echo 6/12: mod LVH, EF 65%, mod AS with mean gradient 28 mmHg, mild to mod MR, mild RAE, no change since 1/12  . Chest pain     Myoview 6/12: no ischemia, EF 64%    Past Surgical History  Procedure Date  . Tonsillectomy   . Back surgery     degenerative joint disease     Allergies  Allergies  Allergen Reactions  . Codeine Other (See Comments)    Reaction unknown  . Hydrocodone Other (See Comments)    Unknown reaction   . Plavix (Clopidogrel Bisulfate)   . Warfarin And Related    CC: nausea, vomiting, chest discomfort   HPI  The patient is a 78 year-old female with a history of AS (moderate on last TTE) and AF (rate controlled, not on AC due to h/o ICH in the past) who came to the ER by EMS.  The patient is a mediocre historian.  She describes several days to weeks of headaches for which she has been taking tylenol with little improvement.  This has been associated with nausea and vomiting (nonbloody)  for the past several days with little po intake during this time.  She complains of subjective chills and feeling "hot."  She complains of vague dyspnea.  She described lower sternal/upper epigastric chest pressure at times radiating to the left arm/neck, continuous for several days that at times has radiated to the back.  Denies palpitations, syncope, or falls.  She has felt a bit lightheaded though.  Denies abdominal pain.  Her daughter, with whom I spoke, confirms most of the above story.  The patient had a similar episode in 05/15/11 for which she was admitted to the Cardiology service at [facility] and found to have dyspepsia/GERD as the primary issue.  Her daughter feels like her heart issues are stable.  She states the current headache has lasted longer than usual.  Home Medications  Prior to Admission medications   Medication Sig Start Date End Date Taking? Authorizing Provider  amitriptyline (ELAVIL) 25 MG tablet Take 1 tablet (25 mg total) by mouth at bedtime. 05/18/11  Yes Hollice Espy, MD  aspirin 81 MG tablet Take 81 mg by mouth every evening.    Yes Historical Provider, MD  CARTIA XT 240 MG 24 hr capsule TAKE ONE CAPSULE BY MOUTH DAILY 03/24/11  Yes Gaylord Shih, MD  diclofenac sodium (VOLTAREN) 1 % GEL Apply 2 g topically daily as needed.  For pain   Yes Historical Provider, MD  levothyroxine (SYNTHROID, LEVOTHROID) 88 MCG tablet Take 88 mcg by mouth daily.    Yes Historical Provider, MD  lisinopril (PRINIVIL,ZESTRIL) 40 MG tablet Take 40 mg by mouth every evening.    Yes Historical Provider, MD  metoprolol (TOPROL-XL) 100 MG 24 hr tablet Take 100 mg by mouth 2 (two) times daily.    Yes Historical Provider, MD  pantoprazole (PROTONIX) 40 MG tablet TAKE ONE TABLET BY MOUTH DAILY 05/12/11  Yes Gaylord Shih, MD  promethazine (PHENERGAN) 12.5 MG tablet Take 12.5 mg by mouth every 6 (six) hours as needed. For nausea   Yes Historical Provider, MD  rosuvastatin (CRESTOR) 40 MG tablet Take  20 mg by mouth daily.    Yes Historical Provider, MD  traMADol (ULTRAM) 50 MG tablet Take 50 mg by mouth every 6 (six) hours as needed. For pain. Maximum dose= 8 tablets per day    Yes Historical Provider, MD  promethazine (PHENERGAN) 12.5 MG tablet Take 0.5 tablets (6.25 mg total) by mouth every 6 (six) hours as needed for nausea. 05/18/11 05/25/11  Hollice Espy, MD    Family History  Family History  Problem Relation Age of Onset  . Transient ischemic attack Mother   . Heart disease Father   . Arrhythmia Sister   . Arrhythmia Brother     Social History  History   Social History  . Marital Status: Widowed    Spouse Name: N/A    Number of Children: N/A  . Years of Education: N/A   Occupational History  . Not on file.   Social History Main Topics  . Smoking status: Never Smoker   . Smokeless tobacco: Not on file  . Alcohol Use: No  . Drug Use: No  . Sexually Active: Not on file   Other Topics Concern  . Not on file   Social History Narrative  . No narrative on file     Review of Systems 12 point review of systems reviewed and are otherwise negative except as noted above. No orthopnea/PND/weight change.  Physical Exam  Blood pressure 163/99, resp. rate 25, SpO2 91.00%. on RA, 99% on 2L; while I was in the room I removed her oxygen and her sat remained in the mid 90s.  No recorded temperature yet.  BP 131/97 right, 146/79 left General: somnolent but arouseable, NAD Psych: does not seem to want to talk to me Neuro: somnolent but converseable, keeps eyes closed, pupils equal and reactive; MAE symmetrically HEENT: MM dry  Neck: Supple, JVP ~5-6 mmHg Lungs:  Resp regular and unlabored, faint crackles posteriorly with very poor effort; with better effort, crackles are less noticeable Heart: irregular rhythm, normal rate, 2/6 SEM heard throughout, early peaking; S2 preserved, carotid upstroke brisk, no S3/S4 Abdomen: Soft, non-tender, non-distended, decreased bowel  sounds Extremities: No clubbing, cyanosis; trace b/l LE edema (chronic per patient); diminished LE pulses, 2+ radial pulses  Labs   Basename 03/24/12 2149  CKTOTAL --  CKMB --  TROPONINI <0.30   Lab Results  Component Value Date   WBC 10.4 03/24/2012   HGB 12.8 03/24/2012   HCT 38.2 03/24/2012   MCV 93.6 03/24/2012   PLT 161 03/24/2012    Lab 03/24/12 2149  NA 134*  K 5.5*  CL 96  CO2 28  BUN 34*  CREATININE 3.05*  CALCIUM 9.5  PROT --  BILITOT --  ALKPHOS --  ALT --  AST --  GLUCOSE 110*  Lab Results  Component Value Date   CHOL 140 05/15/2011   HDL 55 05/15/2011   LDLCALC 63 05/15/2011   TRIG 108 05/15/2011   No results found for this basename: DDIMER     Radiology/Studies  Dg Chest Port 1 View  03/24/2012  *RADIOLOGY REPORT*  Clinical Data: Arrhythmia.  History of hypertension.  PORTABLE CHEST - 1 VIEW 03/24/2012 2201 hours:  Comparison: Two-view chest x-ray 05/16/2011.  Findings: Suboptimal inspiration accounts for crowded bronchovascular markings, especially in the lung bases, and accentuates the cardiac silhouette.  Taking this into account, cardiac silhouette normal in size for AP portable technique. Chronic pleuroparenchymal scarring at the left base with blunting of the left costophrenic angle.  Lungs otherwise clear.  IMPRESSION: No acute cardiopulmonary disease.  Chronic pleuroparenchymal scarring at the left base.   Original Report Authenticated By: Arnell Sieving, M.D.     ECG AF with normal ventricular rate, anterolateral ST/T-wave changes in V5-V6, similar to 05/2011 ECGs  ASSESSMENT/PLAN  Chest Pain, atypical, possibly due to GERD  -check serial biomarkers, continue bid PPI, telemetry  -recheck TTE to re-evaluate AS as it has been almost 2 years, though clinically I doubt it has progressed significantly  -low suspicion for MI given reassuring ECG and negative initial troponin  -I note elevated BNP, but this could be due to AKI and she does not  really have a picture of CHF otherwise Nausea/vomiting  -check APAP level, LFTs, Lipase, TSH  -gentle IVFs, prn Zofran Dehydration  -IVFs, monitor lytes Hyperkalemia  -suspect will improve with rehydration; no ECG signs requiring more aggressive therapy at this point AKI  -gentle IVFs, renal US, follow lytes AF, chronic  -continue rate-controlling meds HTN  -hold ACEI during AKI HLD  -continue Crestor HA for several weeks with APAP use and h/o ICH  -I feel obligated to do a head CT to r/o ICH given history and HA that has lasted several weeks and has not responded to APAP as it usually does   Full Code (confirmed with daughter) DVT prophylaxis with SCDs until ICH ruled out Daughter Carollee Leitz ([phone])    Signed, Chasin Findling, NP 03/25/2012, 1:39 AM

## 2012-03-25 NOTE — ED Notes (Signed)
Pt taken to us.

## 2012-03-25 NOTE — Progress Notes (Signed)
Received a call from nursing regarding UA with (+) nitrite and (+) leukocytes. Discussed with Dr. Graciela Husbands. Given her renal insufficiency of unknown etiology will consult nephrology for recommendations.  Also noted to be bradycardic on cardizem and metoprolol. Will dc cardizem.  [facility], PA-C 03/25/2012 1:22 PM  [phone] pgr

## 2012-03-25 NOTE — Progress Notes (Signed)
Attending MD paged and notified, Pt's confusion worse. Pt refusing to stay in bed. Pt very unsteady on feet and very high fall risk. Orders given for sitter at bedside. No sitter provided at this time. Nursing staff rotating on staying with patient. Family called and will not be able to arrive for a while.  Will continue to monitor pt.

## 2012-03-25 NOTE — Progress Notes (Addendum)
.   Patient Name: [name]      SUBJECTIVE: admitted with chest pain with recurrent atypical pain  major complaint was nausea and vomiting.  She has had baseline significant exercise intolerance manifested by dyspnea on exertion. She also has a history of dizziness primarily orthostatic in nature. She's been noted to be bradycardic. Stress Myoview showed no ischemia with ejection fraction 64% 6-12  Known AF; AS but no CAD  Past Medical History  Diagnosis Date  . Permanent atrial fibrillation   . Mitral valve prolapse   . Sinus bradycardia-tachycardia syndrome   . Hemiplegia affecting unspecified side, late effect of cerebrovascular disease   . Hypertension   . Malignant neoplasm of kidney, except pelvis     s/p L nephrectomy  . Hyperlipidemia   . Osteoarthrosis, unspecified whether generalized or localized, unspecified site   . Peptic ulcer, unspecified site, unspecified as acute or chronic, without mention of hemorrhage, perforation, or obstruction   . Hypothyroidism   . Intracranial bleed     h/o right parietal bleed; not a coumadin candidate  . Aortic stenosis     a. echo 1/12: Ef 55-60%, mod LVH, grade 1 diast dysfxn, mod to severe AS with AVA 0.94 (Vmax) and mean gradient 23 mmHg, PASP 33;   b. echo 6/12: mod LVH, EF 65%, mod AS with mean gradient 28 mmHg, mild to mod MR, mild RAE, no change since 1/12  . Chest pain     Myoview 6/12: no ischemia, EF 64%    PHYSICAL EXAM Filed Vitals:   03/24/12 2050 03/24/12 2051 03/25/12 0242 03/25/12 0439  BP: 163/99  144/100 143/94  Pulse:   70 57  Temp:   98.1 F (36.7 C) 97.7 F (36.5 C)  TempSrc:    Oral  Resp: 25  20 17   Height:    5\' 3"  (1.6 m)  Weight:    139 lb 8.8 oz (63.3 kg)  SpO2: 89% 91% 97% 98%    General appearance: alert, cooperative and no distress Lungs: clear to auscultation bilaterally Heart: irregularly irregular rhythm, S1: normal, S2: single and systolic murmur: systolic ejection 3/6, crescendo and  decrescendo at 2nd left intercostal space Abdomen: soft, non-tender; bowel sounds normal; no masses,  no organomegaly Extremities: extremities normal, atraumatic, no cyanosis or edema Pulses: 2+ and symmetric intact Skin: Skin color, texture, turgor normal. No rashes or lesions Neurologic: Alert and oriented X 3, normal strength and tone. Normal symmetric reflexes. Normal coordination and gait  TELEMETRY: Reviewed telemetry pt in AF with variable and some slow VR:    Intake/Output Summary (Last 24 hours) at 03/25/12 1018 Last data filed at 03/25/12 0546  Gross per 24 hour  Intake      0 ml  Output     50 ml  Net    -50 ml    LABS: Basic Metabolic Panel:  Lab 03/25/12 1914 03/24/12 2149  NA 134* 134*  K 5.4* 5.5*  CL 97 96  CO2 27 28  GLUCOSE 108* 110*  BUN 37* 34*  CREATININE 3.55* 3.05*  CALCIUM 9.2 9.5  MG -- --  PHOS -- --   Cardiac Enzymes:  Basename 03/25/12 0710 03/24/12 2149  CKTOTAL -- --  CKMB -- --  CKMBINDEX -- --  TROPONINI <0.30 <0.30   CBC:  Lab 03/24/12 2149  WBC 10.4  NEUTROABS 8.8*  HGB 12.8  HCT 38.2  MCV 93.6  PLT 161   PROTIME: No results found for this basename:  LABPROT:3,INR:3 in the last 72 hours Liver Function Tests:  [facility] 03/25/12 0252  AST 56*  ALT 19  ALKPHOS 72  BILITOT 0.4  PROT 6.1  ALBUMIN 3.0*    Basename 03/25/12 0252  LIPASE 23  AMYLASE --   BNP: BNP (last 3 results)  Basename 03/24/12 2149  PROBNP 11270.0*      ASSESSMENT AND PLAN:  Patient Active Hospital Problem List: Atrial fibrillation (08/20/2010)   Aortic stenosis (12/09/2010)   Chest pain (03/25/2012)   Acute renal insufficiency, stage IV (severe) (03/25/2012)  Chronic diastolic heart failure  The patient's atypical chest pain is much improved and may well be a function of her gastroesophageal reflux.  She has significant dyspnea on exertion as well as orthostatic lightheadedness. The latter is unlikely related to her valve the  former may be. A repeat echo is pending.  I will at a low dose diuretic. Will have to watch her renal function carefully. Her elevated BNP may be real it was only 420 18 months ago. Although that has been different assay  With her heart failure and aortic stenosis will ambulate and hopefully had early discharge.  Renal failure is new in the last 18 months. Her ACE inhibitor has been discontinued. At we will follow her potassium and consult nephrology Signed, Sherryl Manges MD  03/25/2012

## 2012-03-25 NOTE — ED Notes (Signed)
The pt just returned from c-t.  She remains alert but sleepy

## 2012-03-26 ENCOUNTER — Inpatient Hospital Stay (HOSPITAL_COMMUNITY): Payer: Medicare Other

## 2012-03-26 DIAGNOSIS — I4891 Unspecified atrial fibrillation: Secondary | ICD-10-CM

## 2012-03-26 DIAGNOSIS — N19 Unspecified kidney failure: Secondary | ICD-10-CM

## 2012-03-26 LAB — BASIC METABOLIC PANEL
CO2: 20 mEq/L (ref 19–32)
Calcium: 9 mg/dL (ref 8.4–10.5)
Calcium: 9.2 mg/dL (ref 8.4–10.5)
Chloride: 97 mEq/L (ref 96–112)
Creatinine, Ser: 4.15 mg/dL — ABNORMAL HIGH (ref 0.50–1.10)
GFR calc Af Amer: 11 mL/min — ABNORMAL LOW (ref >90)
GFR calc non Af Amer: 9 mL/min — ABNORMAL LOW (ref >90)
Sodium: 129 mEq/L — ABNORMAL LOW (ref 135–145)
Sodium: 131 mEq/L — ABNORMAL LOW (ref 135–145)

## 2012-03-26 LAB — T4, FREE: Free T4: 1.53 ng/dL (ref 0.80–1.80)

## 2012-03-26 LAB — HIV ANTIBODY (ROUTINE TESTING W REFLEX): HIV: NONREACTIVE

## 2012-03-26 MED ORDER — SODIUM POLYSTYRENE SULFONATE 15 GM/60ML PO SUSP
30.0000 g | Freq: Once | ORAL | Status: DC
  Filled 2012-03-26: qty 120

## 2012-03-26 MED ORDER — MEPERIDINE HCL 25 MG/ML IJ SOLN
12.5000 mg | INTRAMUSCULAR | Status: DC | PRN
  Administered 2012-03-26: 12.5 mg via INTRAVENOUS
  Filled 2012-03-26: qty 1

## 2012-03-26 MED ORDER — DEXTROSE 5 % IV SOLN
500.0000 mg | INTRAVENOUS | Status: DC
  Administered 2012-03-26: 500 mg via INTRAVENOUS
  Filled 2012-03-26: qty 500

## 2012-03-26 MED ORDER — HEPARIN (PORCINE) IN NACL 100-0.45 UNIT/ML-% IJ SOLN
900.0000 [IU]/h | INTRAMUSCULAR | Status: DC
  Administered 2012-03-26: 900 [IU]/h via INTRAVENOUS
  Filled 2012-03-26: qty 250

## 2012-03-26 MED ORDER — METHYLPREDNISOLONE SODIUM SUCC 125 MG IJ SOLR: 250.0000 mg | INTRAMUSCULAR | Status: DC

## 2012-03-26 MED ORDER — LORAZEPAM 2 MG/ML IJ SOLN
0.5000 mg | INTRAMUSCULAR | Status: DC | PRN
  Administered 2012-03-26 – 2012-03-27 (×4): 0.5 mg via INTRAVENOUS
  Filled 2012-03-26 (×4): qty 1

## 2012-03-26 MED ORDER — HEPARIN BOLUS VIA INFUSION
3000.0000 [IU] | Freq: Once | INTRAVENOUS | Status: DC
  Filled 2012-03-26: qty 3000

## 2012-03-26 MED ORDER — LORAZEPAM 2 MG/ML IJ SOLN
1.0000 mg | INTRAMUSCULAR | Status: DC | PRN
  Administered 2012-03-27 – 2012-03-29 (×6): 1 mg via INTRAVENOUS
  Filled 2012-03-26 (×8): qty 1

## 2012-03-26 MED ORDER — FENTANYL CITRATE 0.05 MG/ML IJ SOLN
12.5000 ug | INTRAMUSCULAR | Status: DC | PRN
  Administered 2012-03-26 – 2012-03-27 (×5): 12.5 ug via INTRAVENOUS
  Filled 2012-03-26 (×5): qty 2

## 2012-03-26 MED ORDER — FUROSEMIDE 10 MG/ML IJ SOLN
160.0000 mg | Freq: Once | INTRAVENOUS | Status: AC
  Administered 2012-03-26: 160 mg via INTRAVENOUS
  Filled 2012-03-26: qty 16

## 2012-03-26 NOTE — Progress Notes (Signed)
CRITICAL VALUE ALERT  Critical value received: K+ 6.6 Date of notification:  03/25/2012  Time of notification:  2050  Critical value read back:yes  Nurse who received alert:  Vickie Epley, RN  MD notified (1st page):  Benedetto Coons, NP Time of first page:  2055  Responding MD:  Benedetto Coons, NP  Time MD responded:  2100

## 2012-03-26 NOTE — Progress Notes (Addendum)
[facility] PROGRESS NOTE  [name] NFA:[id number] DOB: 1933/07/08 DOA: 03/24/2012 PCP: Michiel Sites, MD  Assessment/Plan:  Acute renal failure in setting of solitary right kidney  - no renal calculi or hydronephrosis were visualized on CT and renal duplex is negative for obstruction of the renal artery - Dr Arlean Hopping does not suspect ATN- not a prerenal state as giving IVF did not improve the creatinine- rapidly progressive glomerulonephritis? -family no longer wanting aggressive work up or dialysis- I have contacted palliative care.   Right sided flank/abdominal pain Suspect this is related to her right kidney and the underlying etiology- at this point we will make her comfortable with pain medications.   Diastolic CHF Fluid overloaded on CXR performed today  Alerted mental status- encephalopathy PRN Ativan requested by family for agitation.   H/o renal cancer and resection of left kidney  Code Status: DNR Family Communication: discussed with the patient's daughter and son Disposition Plan: transfer out of SDU after palliative care meeting   Brief narrative: 78 year old woman who presents to the emergency room with complaints of not feeling well pain in the chest and abdomen. In addition to pain, she was noted to develop acute renal failure and then altered mental status. She had been anuric and continued to complain of pain in the right flank radiating to the right abdomen.  Consultants:  Nephrology  Procedures:  none  Antibiotics:  Rocephin - 9/14- 9/15  Zithromax- 9/15  HPI/Subjective: Patient noted to be lethargic, family at bedside. I have discussed with the family that Dr Arlean Hopping feels that her constellation of symptoms and Acute renal failure may be related to a embolus (a-fib) obstructing the renal artery. They have agree to a renal duplex. Results of the duplex are negative for obstruction of renal artery. Upon further discussion with the patient's  family and Dr Arlean Hopping, I am told that the patient did not want to be kept alive via dialysis or any other artificial means. The family has decided to forgo further work up of the renal failure and had declined dialysis as a treatment option. They are deciding upon comfort care for the patient. I have contacted palliative care.   Objective: Filed Vitals:   03/26/12 0515 03/26/12 0728 03/26/12 0810 03/26/12 1230  BP: 143/44  126/39 110/48  Pulse: 58  73   Temp:  97.7 F (36.5 C)    TempSrc:  Axillary    Resp: 22  29 30   Height:      Weight:      SpO2: 93%  90% 97%    Intake/Output Summary (Last 24 hours) at 03/26/12 1759 Last data filed at 03/26/12 1300  Gross per 24 hour  Intake    797 ml  Output      0 ml  Net    797 ml    Exam:   General:  Drowsy, mildly restless  Cardiovascular: RRR, no murmurs  Respiratory: decreased breath sounds  Abdomen: soft, non-distended, BS+- due to patient's alerted mentation, unable to tell if there is tenderness  Ext: no c/c/e  Data Reviewed: Basic Metabolic Panel:  Lab 03/26/12 4696 03/25/12 2323 03/25/12 1954 03/25/12 0710 03/24/12 2149  NA 131* 129* 132* 134* 134*  K 4.9 5.7* 6.6* 5.4* 5.5*  CL 96 97 98 97 96  CO2 20 16* 20 27 28   GLUCOSE 149* 237* 177* 108* 110*  BUN 43* 42* 39* 37* 34*  CREATININE 4.43* 4.15* 3.99* 3.55* 3.05*  CALCIUM 9.2 9.0 8.7 9.2 9.5  MG -- -- -- -- --  PHOS -- -- -- -- --   Liver Function Tests:  Lab 03/25/12 1954 03/25/12 0252  AST 46* 56*  ALT 30 19  ALKPHOS 85 72  BILITOT 0.4 0.4  PROT 6.5 6.1  ALBUMIN 3.1* 3.0*    Lab 03/25/12 1954 03/25/12 0252  LIPASE 28 23  AMYLASE -- --   No results found for this basename: AMMONIA:5 in the last 168 hours CBC:  Lab 03/25/12 1954 03/24/12 2149  WBC 15.9* 10.4  NEUTROABS -- 8.8*  HGB 13.1 12.8  HCT 40.2 38.2  MCV 96.2 93.6  PLT 201 161   Cardiac Enzymes:  Lab 03/25/12 1954 03/25/12 1629 03/25/12 1015 03/25/12 0710 03/24/12 2149  CKTOTAL 102  -- -- -- --  CKMB -- -- -- -- --  CKMBINDEX -- -- -- -- --  TROPONINI -- <0.30 <0.30 <0.30 <0.30   BNP (last 3 results)  Basename 03/24/12 2149  PROBNP 11270.0*   CBG:  Lab 03/25/12 1712  GLUCAP 150*    Recent Results (from the past 240 hour(s))  MRSA PCR SCREENING     Status: Normal   Collection Time   03/25/12  8:31 PM      Component Value Range Status Comment   MRSA by PCR NEGATIVE  NEGATIVE Final      Studies: Ct Abdomen Pelvis Wo Contrast  03/26/2012  *RADIOLOGY REPORT*  Clinical Data: Abdominal pain, acute renal failure.  CT ABDOMEN AND PELVIS WITHOUT CONTRAST  Technique:  Multidetector CT imaging of the abdomen and pelvis was performed following the standard protocol without intravenous contrast.  Comparison: 03/25/2012 ultrasound  Findings: Coronary artery and aortic valve calcifications.  Heart size upper normal to mildly enlarged.  Trace pericardial fluid. Bilateral pleural effusions, right greater than left with associated consolidations; atelectasis versus pneumonia.  Organ abnormality/lesion detection is limited in the absence of intravenous contrast. Within this limitation, punctate calcifications within the liver and spleen are in keeping with sequelae of granulomas infection.  Distended gallbladder with high attenuation fluid.  No biliary ductal dilatation.  Right renal contours mildly lobular with a nonspecific subcentimeter hypodensity. No hydronephrosis or hydroureter.  Absent left kidney.  Small amount of fluid attenuation along the posterior pararenal space is nonspecific.  There is contrast within the colon, presumably from an outside prior examination.  Colonic diverticulosis.  No definite evidence for diverticulitis.  Motion degraded mid abdominal images.  Normal appendix.  No free intraperitoneal air or fluid.  No lymphadenopathy.  Advanced atherosclerosis of the aorta and branch vessels.  Further vascular evaluation not possible without intravenous contrast.   Decompressed bladder.  Unremarkable CT appearance to the uterus and adnexa.  Multilevel degenerative changes.  No acute osseous finding.  There is leftward curvature of the spine.  IMPRESSION: Bilateral pleural effusions and associated consolidations; atelectasis versus pneumonia.  High attenuation within the gallbladder presumably represents vicarious excretion. Contrast within the colon suggests a recent prior exam.  Status post left nephrectomy.  There is a small amount nonspecific fluid attenuation in the surgical bed.  The right kidney has a mildly lobular contour.  No hydronephrosis or hydroureter.  Advanced atherosclerotic disease of the aorta and branch vessels. Further vascular evaluation is not possible without intravenous contrast.   Original Report Authenticated By: Waneta Martins, M.D.    Ct Head Wo Contrast  03/25/2012  *RADIOLOGY REPORT*  Clinical Data: Headache  CT HEAD WITHOUT CONTRAST  Technique:  Contiguous axial images were obtained from the base  of the skull through the vertex without contrast.  Comparison: 03/26/2011  Findings: Several slices are mildly degraded by patient motion/streak artifact.  Allowing for this, periventricular and subcortical white matter hypodensities are most in keeping with chronic microangiopathic change.  Atherosclerotic vascular calcifications.  No hydrocephalous.  No definite intraparenchymal hemorrhage, mass, mass effect, or abnormal extra-axial fluid collection.  No definite CT evidence of acute infarction. The visualized paranasal sinuses and mastoid air cells are predominately clear. Left maxillary fibrous dysplasia again noted.  IMPRESSION: White matter changes.  No definite acute intracranial abnormality.   Original Report Authenticated By: Waneta Martins, M.D.    US Renal  03/25/2012  *RADIOLOGY REPORT*  Clinical Data: Acute renal failure, history of left nephrectomy.  RENAL/URINARY TRACT ULTRASOUND COMPLETE  Comparison:  None.  Findings:  Right  Kidney:  Measures 10.5 cm.  Normal echogenicity.  No hydronephrosis.  Left Kidney:  Absent  Bladder:  Decompressed  IMPRESSION: Normal sonographic appearance to the right kidney.  Status post left nephrectomy.   Original Report Authenticated By: Waneta Martins, M.D.    Dg Chest Port 1 View  03/26/2012  *RADIOLOGY REPORT*  Clinical Data: Rhonchi at physical examination.  Volume overload.  PORTABLE CHEST - 1 VIEW 03/25/2012 2053 hours:  Comparison: Portable chest x-ray yesterday and 05/15/2011.  Findings: Interval development of interstitial and airspace opacities throughout the right lung and, to a lesser degree, the left lung since the examination yesterday.  Bilateral pleural effusions, increased in size.  Cardiac silhouette mildly enlarged but stable.  IMPRESSION: Asymmetric pulmonary edema, right greater than left, new since yesterday.  Enlarging bilateral pleural effusions.   Original Report Authenticated By: Arnell Sieving, M.D.    Dg Chest Port 1 View  03/24/2012  *RADIOLOGY REPORT*  Clinical Data: Arrhythmia.  History of hypertension.  PORTABLE CHEST - 1 VIEW 03/24/2012 2201 hours:  Comparison: Two-view chest x-ray 05/16/2011.  Findings: Suboptimal inspiration accounts for crowded bronchovascular markings, especially in the lung bases, and accentuates the cardiac silhouette.  Taking this into account, cardiac silhouette normal in size for AP portable technique. Chronic pleuroparenchymal scarring at the left base with blunting of the left costophrenic angle.  Lungs otherwise clear.  IMPRESSION: No acute cardiopulmonary disease.  Chronic pleuroparenchymal scarring at the left base.   Original Report Authenticated By: Arnell Sieving, M.D.     Scheduled Meds:   . calcium gluconate  1 g Intravenous Once  . dextrose  1 ampule Intravenous Once  . furosemide  160 mg Intravenous Once  . insulin aspart  10 Units Subcutaneous Once  . iohexol  20 mL Oral Q1 Hr x 2  . levothyroxine  26 mcg  Intravenous Daily  . sodium chloride  3 mL Intravenous Q12H  . sodium polystyrene  30 g Oral Once  . sodium polystyrene  30 g Rectal Once  . DISCONTD: aspirin  81 mg Oral Daily  . DISCONTD: atorvastatin  40 mg Oral q1800  . DISCONTD: azithromycin  500 mg Intravenous Q24H  . DISCONTD: cefTRIAXone (ROCEPHIN) IVPB 1 gram/50 mL D5W  1 g Intravenous Q24H  . DISCONTD: feeding supplement  237 mL Oral BID BM  . DISCONTD: heparin  3,000 Units Intravenous Once  . DISCONTD: influenza  inactive virus vaccine  0.5 mL Intramuscular Tomorrow-1000  . DISCONTD: levothyroxine  88 mcg Oral Q0600  . DISCONTD: methylPREDNISolone (SOLU-MEDROL) injection  250 mg Intravenous Q24H  . DISCONTD: metoprolol succinate  100 mg Oral BID  . DISCONTD: pantoprazole  40  mg Oral BID AC  . DISCONTD: pantoprazole (PROTONIX) IV  40 mg Intravenous Q24H  . DISCONTD: pneumococcal 23 valent vaccine  0.5 mL Intramuscular Tomorrow-1000  . DISCONTD: polyethylene glycol  17 g Oral Daily   Continuous Infusions:   . sodium chloride 150 mL/hr at 03/25/12 1642  . DISCONTD: heparin 900 Units/hr (03/26/12 1300)    ________________________________________________________________________  Time spent: 55 min     [facility]  [facility] Pager [phone] If 8PM-8AM, please contact night-coverage at [email or web address], password [facility] 03/26/2012, 5:59 PM  LOS: 2 days

## 2012-03-26 NOTE — Progress Notes (Signed)
Foley catheter removed from patient after checking placement; catheter was not in bladder; attempt was made to reinsert catheter x 3 without success due to anatomy of patient; will continue to check patient for urinary incontinences; small amount of drainage noted during attempt to replace urinary catheter

## 2012-03-26 NOTE — Progress Notes (Signed)
Pt was moaning in pain,md called and arrived to assess. Demerol given for pain at 1200 and at 1230 pt was no longer guarding her right side and no longer moaning.However, pt was extremely agitated (had been even before demerol), pt's family had been holding her hands,consoling her for over 30 minutes with no improvement in agitation. Pt pulling at iv, trying to get oob. md called and 0.5 mg ativan given iv at 1230. Pt is now resting. vss.

## 2012-03-26 NOTE — Progress Notes (Signed)
VASCULAR LAB PRELIMINARY  PRELIMINARY  PRELIMINARY  PRELIMINARY  Renal artery duplex completed.    Preliminary report:  Renal artery flow noted.  Intraparenchymal flow noted.  Normal peak systolic velocities.  Renal artery resistive index indicates borderline renovascular resistance.    [name], RVT 03/26/2012, 1:29 PM

## 2012-03-26 NOTE — Progress Notes (Signed)
[facility] progress note. Chief complaint. Altered mental status. History of present illness. This 78 year old female admitted initially admitted the to the cardiology service now followed by [facility]. Her main problems during this hospitalization so far has been altered mental status, acute renal failure with an area, abdominal pain, congestive heart failure, hyperkalemia. The patient has been ordered several labs and a CT scan of the abdomen/pelvis which have now resulted. Her metabolic panel a resulted earlier showing a serum potassium of 6.6. I ordered a Kayexalate 30 g per rectum as the patient would not take this orally. Also gram of calcium gluconate and an ampule of D50 followed by 10 units of Novolin insulin were given. A followup potassium resulted at 5.7 though this was obtained shortly after the Kayexalate enema had completed and I do not think it accurately reflects the final potassium. Her CBC does demonstrate leukocytosis with WBC at 15.9. Her lactic acid was 2.1 and pro calcitonin 0.31. A portable chest x-ray was obtained but this result is still pending. The CT scan of the abdomen and pelvis was ordered with oral contrast but unfortunately the patient would not drink this. I attempted to administer by NG tube but a NG tube was unable to be placed. The the CT scan of the abdomen and pelvis resulted showing bilateral pleural effusions and associated consolidations atelectasis versus pneumonia. High attenuation within the gallbladder presumably represents vicarious excretion. Contrast within the colon suggest a recent prior exam. Status post left nephrectomy. Small amount of nonspecific fluid attenuation in the surgical bed. Right kidney has mild lobar contour but no hydronephrosis or hydroureter. Advanced atherosclerotic disease of the aorta and branches vessels. Further vascular evaluation not possible without IV contrast. Nursing noted that the patient's Foley catheter was within  the vagina. We did attempt to place a Foley catheter in the ureter, even with a pediatric catheter, but were unable to do so. Vital signs. Temperature 97.2, pulse 64, respiration 24, blood pressure 133/52. O2 sats 97% on low-flow nasal cannula oxygen. General appearance. Frail appearing elderly female who is alert but quite confused. No evidence of distress.  Cardiac. Rate and rhythm regular. 2/6 systolic ejection murmur. No jugular venous distention. Lungs. Some crackles in the left base. Diminished breath sounds throughout the right lung fields but typically in the right base. Mild expiratory wheezing. Abdomen. Soft with positive bowel sounds. Mild diffuse tenderness with palpation but no guarding or rebound tenderness. Impression/plan. Problem #1 acute renal failure with an area. Patient has made no urine this shift independently. No evidence of bladder distention per assessment. Staff Unable to insert Foley catheter I do to inflammation in that area. I note the BUN and creatinine are continuing to climb. I may need urology consult in the a.m. for Foley placement. Problem #2 hyperkalemia. This probably secondary to the acute renal failure. She's been given 30 g of Kayexalate per rectum, 1 g of calcium gluconate, one ampule of D50 followed by 10 units of NovoLog insulin. Her potassium level has declined from 6.6 to 5.7 following these interventions though I do not think that the follow up lab accurately reflects the current potassium as it was drawn so closely to the Kayexalate administration. I will repeat a serum potassium at 03:00 this a.m. and administer further Kayexalate if indicated. Problem #3 possible community-acquired pneumonia. The CT scan of the abdomen and pelvis suggests a possible pneumonia. This, along with UTI may be her current source of infection. I will broaden antibiotic therapy with the  addition of azithromycin to the current Rocephin. Blood cultures are already ordered and pending.  Will also obtain a sputum for a culture and Gram stain, HIV-antibody, Legionella antigen and strep pneumonia urinary antigen.

## 2012-03-26 NOTE — Progress Notes (Addendum)
Renal artery doppler shows +flow in R renal artery.  Resistive index is on high side.  Renal infarction less likely. Could have an acute GN; no urine available to examine, she did have protein and rbc's on admission UA. She has not made any urine since yesterday morning. She has worsening uremia w declining mental status, and now has pulm edema as well.  No response to one dose of IV lasix. Prognosis is very poor w/o dialysis at this point.  Discussed with family.  They do not want dialysis as per patient's previous request. Plan is for comfort care. They would like Palliative Care team to be consulted. Will d/w primary MD.

## 2012-03-26 NOTE — Progress Notes (Addendum)
Pt was given 30g kayexlate per rectal tube; tube was left in for 30 minutes; as of now; no results, but will continue to monitor; unable to place NGT for contrast for CT Scan and medication secondary to pt being uncooperative and screaming during attempts to insert 62F NGT;

## 2012-03-26 NOTE — Progress Notes (Signed)
Patient ID: JACQUIE LUKES, female   DOB: February 14, 1933, 78 y.o.   MRN: [id number]   Patient Name: [name] Date of Encounter: 03/26/2012    SUBJECTIVE  Confused. Tells her family that she is not in any discomfort. Renal function worse. UTI and pneumonia being treated. Echocardiogram showed moderate aortic stenosis which is stable from 2012. She is chronic A. fib with a well-controlled rate. Hyperkalemia has resolved.  CURRENT MEDS    . aspirin  81 mg Oral Daily  . atorvastatin  40 mg Oral q1800  . azithromycin  500 mg Intravenous Q24H  . calcium gluconate  1 g Intravenous Once  . cefTRIAXone (ROCEPHIN) IVPB 1 gram/50 mL D5W  1 g Intravenous Q24H  . dextrose  1 ampule Intravenous Once  . influenza  inactive virus vaccine  0.5 mL Intramuscular Tomorrow-1000  . insulin aspart  10 Units Subcutaneous Once  . iohexol  20 mL Oral Q1 Hr x 2  . levothyroxine  26 mcg Intravenous Daily  . pantoprazole (PROTONIX) IV  40 mg Intravenous Q24H  . pneumococcal 23 valent vaccine  0.5 mL Intramuscular Tomorrow-1000  . sodium chloride  1,000 mL Intravenous Once  . sodium chloride  3 mL Intravenous Q12H  . sodium polystyrene  30 g Oral Once  . sodium polystyrene  30 g Rectal Once  . DISCONTD: diltiazem  240 mg Oral Daily  . DISCONTD: feeding supplement  237 mL Oral BID BM  . DISCONTD: levothyroxine  88 mcg Oral Q0600  . DISCONTD: metoprolol succinate  100 mg Oral BID  . DISCONTD: pantoprazole  40 mg Oral BID AC  . DISCONTD: polyethylene glycol  17 g Oral Daily    OBJECTIVE  Filed Vitals:   03/26/12 0300 03/26/12 0500 03/26/12 0515 03/26/12 0728  BP: 152/55  143/44   Pulse:   58   Temp:    97.7 F (36.5 C)  TempSrc:    Axillary  Resp: 23  22   Height:      Weight:  153 lb (69.4 kg)    SpO2:   93%     Intake/Output Summary (Last 24 hours) at 03/26/12 0921 Last data filed at 03/26/12 0600  Gross per 24 hour  Intake    832 ml  Output      0 ml  Net    832 ml   Filed Weights   03/25/12 0439 03/25/12 1924 03/26/12 0500  Weight: 139 lb 8.8 oz (63.3 kg) 149 lb 11.1 oz (67.9 kg) 153 lb (69.4 kg)    PHYSICAL EXAM  General confused and trembling HEENT:  Normal  Neck: Supple without bruits or JVD. Lungs:  Resp regular and unlabored, CTA. Heart: Irregular rate and rhythm, aortic stenosis murmur Abdomen: Soft, non-tender, non-distended, BS + x 4.  Extremities: No clubbing, cyanosis or edema. DP/PT/Radials 2+ and equal bilaterally.  Accessory Clinical Findings  CBC  Basename 03/25/12 1954 03/24/12 2149  WBC 15.9* 10.4  NEUTROABS -- 8.8*  HGB 13.1 12.8  HCT 40.2 38.2  MCV 96.2 93.6  PLT 201 161   Basic Metabolic Panel  Basename 03/26/12 0342 03/25/12 2323  NA 131* 129*  K 4.9 5.7*  CL 96 97  CO2 20 16*  GLUCOSE 149* 237*  BUN 43* 42*  CREATININE 4.43* 4.15*  CALCIUM 9.2 9.0  MG -- --  PHOS -- --   Liver Function Tests  Basename 03/25/12 1954 03/25/12 0252  AST 46* 56*  ALT 30 19  ALKPHOS 85 72  BILITOT 0.4 0.4  PROT 6.5 6.1  ALBUMIN 3.1* 3.0*    Basename 03/25/12 1954 03/25/12 0252  LIPASE 28 23  AMYLASE -- --   Cardiac Enzymes  Basename 03/25/12 1954 03/25/12 1629 03/25/12 1015 03/25/12 0710  CKTOTAL 102 -- -- --  CKMB -- -- -- --  CKMBINDEX -- -- -- --  TROPONINI -- <0.30 <0.30 <0.30   BNP No components found with this basename: POCBNP:3 D-Dimer No results found for this basename: DDIMER:2 in the last 72 hours Hemoglobin A1C No results found for this basename: HGBA1C in the last 72 hours Fasting Lipid Panel No results found for this basename: CHOL,HDL,LDLCALC,TRIG,CHOLHDL,LDLDIRECT in the last 72 hours Thyroid Function Tests  Basename 03/25/12 0252  TSH 1.054  T4TOTAL --  T3FREE --  THYROIDAB --    TELE A. fib chronic  ECG  Radiology/Studies  Ct Abdomen Pelvis Wo Contrast  03/26/2012  *RADIOLOGY REPORT*  Clinical Data: Abdominal pain, acute renal failure.  CT ABDOMEN AND PELVIS WITHOUT CONTRAST  Technique:   Multidetector CT imaging of the abdomen and pelvis was performed following the standard protocol without intravenous contrast.  Comparison: 03/25/2012 ultrasound  Findings: Coronary artery and aortic valve calcifications.  Heart size upper normal to mildly enlarged.  Trace pericardial fluid. Bilateral pleural effusions, right greater than left with associated consolidations; atelectasis versus pneumonia.  Organ abnormality/lesion detection is limited in the absence of intravenous contrast. Within this limitation, punctate calcifications within the liver and spleen are in keeping with sequelae of granulomas infection.  Distended gallbladder with high attenuation fluid.  No biliary ductal dilatation.  Right renal contours mildly lobular with a nonspecific subcentimeter hypodensity. No hydronephrosis or hydroureter.  Absent left kidney.  Small amount of fluid attenuation along the posterior pararenal space is nonspecific.  There is contrast within the colon, presumably from an outside prior examination.  Colonic diverticulosis.  No definite evidence for diverticulitis.  Motion degraded mid abdominal images.  Normal appendix.  No free intraperitoneal air or fluid.  No lymphadenopathy.  Advanced atherosclerosis of the aorta and branch vessels.  Further vascular evaluation not possible without intravenous contrast.  Decompressed bladder.  Unremarkable CT appearance to the uterus and adnexa.  Multilevel degenerative changes.  No acute osseous finding.  There is leftward curvature of the spine.  IMPRESSION: Bilateral pleural effusions and associated consolidations; atelectasis versus pneumonia.  High attenuation within the gallbladder presumably represents vicarious excretion. Contrast within the colon suggests a recent prior exam.  Status post left nephrectomy.  There is a small amount nonspecific fluid attenuation in the surgical bed.  The right kidney has a mildly lobular contour.  No hydronephrosis or hydroureter.   Advanced atherosclerotic disease of the aorta and branch vessels. Further vascular evaluation is not possible without intravenous contrast.   Original Report Authenticated By: Waneta Martins, M.D.    Ct Head Wo Contrast  03/25/2012  *RADIOLOGY REPORT*  Clinical Data: Headache  CT HEAD WITHOUT CONTRAST  Technique:  Contiguous axial images were obtained from the base of the skull through the vertex without contrast.  Comparison: 03/26/2011  Findings: Several slices are mildly degraded by patient motion/streak artifact.  Allowing for this, periventricular and subcortical white matter hypodensities are most in keeping with chronic microangiopathic change.  Atherosclerotic vascular calcifications.  No hydrocephalous.  No definite intraparenchymal hemorrhage, mass, mass effect, or abnormal extra-axial fluid collection.  No definite CT evidence of acute infarction. The visualized paranasal sinuses and mastoid air cells are predominately clear. Left maxillary fibrous dysplasia  again noted.  IMPRESSION: White matter changes.  No definite acute intracranial abnormality.   Original Report Authenticated By: Waneta Martins, M.D.    US Renal  03/25/2012  *RADIOLOGY REPORT*  Clinical Data: Acute renal failure, history of left nephrectomy.  RENAL/URINARY TRACT ULTRASOUND COMPLETE  Comparison:  None.  Findings:  Right Kidney:  Measures 10.5 cm.  Normal echogenicity.  No hydronephrosis.  Left Kidney:  Absent  Bladder:  Decompressed  IMPRESSION: Normal sonographic appearance to the right kidney.  Status post left nephrectomy.   Original Report Authenticated By: Waneta Martins, M.D.    Dg Chest Port 1 View  03/26/2012  *RADIOLOGY REPORT*  Clinical Data: Rhonchi at physical examination.  Volume overload.  PORTABLE CHEST - 1 VIEW 03/25/2012 2053 hours:  Comparison: Portable chest x-ray yesterday and 05/15/2011.  Findings: Interval development of interstitial and airspace opacities throughout the right lung and, to a  lesser degree, the left lung since the examination yesterday.  Bilateral pleural effusions, increased in size.  Cardiac silhouette mildly enlarged but stable.  IMPRESSION: Asymmetric pulmonary edema, right greater than left, new since yesterday.  Enlarging bilateral pleural effusions.   Original Report Authenticated By: Arnell Sieving, M.D.    Dg Chest Port 1 View  03/24/2012  *RADIOLOGY REPORT*  Clinical Data: Arrhythmia.  History of hypertension.  PORTABLE CHEST - 1 VIEW 03/24/2012 2201 hours:  Comparison: Two-view chest x-ray 05/16/2011.  Findings: Suboptimal inspiration accounts for crowded bronchovascular markings, especially in the lung bases, and accentuates the cardiac silhouette.  Taking this into account, cardiac silhouette normal in size for AP portable technique. Chronic pleuroparenchymal scarring at the left base with blunting of the left costophrenic angle.  Lungs otherwise clear.  IMPRESSION: No acute cardiopulmonary disease.  Chronic pleuroparenchymal scarring at the left base.   Original Report Authenticated By: Arnell Sieving, M.D.     ASSESSMENT AND PLAN  Principal Problem:  *Acute renal failure Active Problems:  RENAL CELL CANCER  HYPOTHYROIDISM  HYPERLIPIDEMIA  HYPERTENSION  Atrial fibrillation  CEREBROVASCULAR ACCIDENT WITH RIGHT HEMIPARESIS  PUD  Aortic stenosis  GERD (gastroesophageal reflux disease)  Nausea and vomiting in adult  Chest pain  Chronic diastolic heart failure  Hyperkalemia  Encephalopathy acute  Abdominal pain    Cardiac-wise she is stable with chronic A. fib with a well-controlled rate and moderate aortic stenosis. Her daughter and granddaughter at the bedside. A discussion at length about aggressiveness of care. Patient stated she does not want dialysis before she became confused. I agree with this. Hopefully, her kidney function will improve and  her mental status will improve.  Signed, Valera Castle MD

## 2012-03-26 NOTE — Progress Notes (Addendum)
[facility] Progress Note    Subjective:   Worsened confusion yesterday with sitter ordered.   Objective:   BP 143/44  Pulse 58  Temp 97.7 F (36.5 C) (Axillary)  Resp 22  Ht 5\' 3"  (1.6 m)  Wt 153 lb (69.4 kg)  BMI 27.10 kg/m2  SpO2 93%  Intake/Output Summary (Last 24 hours) at 03/26/12 0744 Last data filed at 03/26/12 0600  Gross per 24 hour  Intake    952 ml  Output      0 ml  Net    952 ml   Weight change: 10 lb 2.3 oz (4.6 kg)  Physical Exam: ZOX:[id number], difficult to arouse/get response CVS:RRR, no m/r/g Resp:CTA b/l, but difficult to appreciate lower lobes Abd: soft, diffusely tender with voluntary guarding, normoactive BS Ext: no pedal edema, warm, well perfused  Imaging: Ct Abdomen Pelvis Wo Contrast  03/26/2012  *RADIOLOGY REPORT*  Clinical Data: Abdominal pain, acute renal failure.  CT ABDOMEN AND PELVIS WITHOUT CONTRAST  Technique:  Multidetector CT imaging of the abdomen and pelvis was performed following the standard protocol without intravenous contrast.  Comparison: 03/25/2012 ultrasound  Findings: Coronary artery and aortic valve calcifications.  Heart size upper normal to mildly enlarged.  Trace pericardial fluid. Bilateral pleural effusions, right greater than left with associated consolidations; atelectasis versus pneumonia.  Organ abnormality/lesion detection is limited in the absence of intravenous contrast. Within this limitation, punctate calcifications within the liver and spleen are in keeping with sequelae of granulomas infection.  Distended gallbladder with high attenuation fluid.  No biliary ductal dilatation.  Right renal contours mildly lobular with a nonspecific subcentimeter hypodensity. No hydronephrosis or hydroureter.  Absent left kidney.  Small amount of fluid attenuation along the posterior pararenal space is nonspecific.  There is contrast within the colon, presumably from an outside prior examination.  Colonic  diverticulosis.  No definite evidence for diverticulitis.  Motion degraded mid abdominal images.  Normal appendix.  No free intraperitoneal air or fluid.  No lymphadenopathy.  Advanced atherosclerosis of the aorta and branch vessels.  Further vascular evaluation not possible without intravenous contrast.  Decompressed bladder.  Unremarkable CT appearance to the uterus and adnexa.  Multilevel degenerative changes.  No acute osseous finding.  There is leftward curvature of the spine.  IMPRESSION: Bilateral pleural effusions and associated consolidations; atelectasis versus pneumonia.  High attenuation within the gallbladder presumably represents vicarious excretion. Contrast within the colon suggests a recent prior exam.  Status post left nephrectomy.  There is a small amount nonspecific fluid attenuation in the surgical bed.  The right kidney has a mildly lobular contour.  No hydronephrosis or hydroureter.  Advanced atherosclerotic disease of the aorta and branch vessels. Further vascular evaluation is not possible without intravenous contrast.   Original Report Authenticated By: Waneta Martins, M.D.    Ct Head Wo Contrast  03/25/2012  *RADIOLOGY REPORT*  Clinical Data: Headache  CT HEAD WITHOUT CONTRAST  Technique:  Contiguous axial images were obtained from the base of the skull through the vertex without contrast.  Comparison: 03/26/2011  Findings: Several slices are mildly degraded by patient motion/streak artifact.  Allowing for this, periventricular and subcortical white matter hypodensities are most in keeping with chronic microangiopathic change.  Atherosclerotic vascular calcifications.  No hydrocephalous.  No definite intraparenchymal hemorrhage, mass, mass effect, or abnormal extra-axial fluid collection.  No definite CT evidence of acute infarction. The visualized paranasal sinuses and mastoid air cells are predominately clear. Left maxillary fibrous dysplasia again noted.  IMPRESSION: White matter  changes.  No definite acute intracranial abnormality.   Original Report Authenticated By: Waneta Martins, M.D.    US Renal  03/25/2012  *RADIOLOGY REPORT*  Clinical Data: Acute renal failure, history of left nephrectomy.  RENAL/URINARY TRACT ULTRASOUND COMPLETE  Comparison:  None.  Findings:  Right Kidney:  Measures 10.5 cm.  Normal echogenicity.  No hydronephrosis.  Left Kidney:  Absent  Bladder:  Decompressed  IMPRESSION: Normal sonographic appearance to the right kidney.  Status post left nephrectomy.   Original Report Authenticated By: Waneta Martins, M.D.    Dg Chest Port 1 View  03/24/2012  *RADIOLOGY REPORT*  Clinical Data: Arrhythmia.  History of hypertension.  PORTABLE CHEST - 1 VIEW 03/24/2012 2201 hours:  Comparison: Two-view chest x-ray 05/16/2011.  Findings: Suboptimal inspiration accounts for crowded bronchovascular markings, especially in the lung bases, and accentuates the cardiac silhouette.  Taking this into account, cardiac silhouette normal in size for AP portable technique. Chronic pleuroparenchymal scarring at the left base with blunting of the left costophrenic angle.  Lungs otherwise clear.  IMPRESSION: No acute cardiopulmonary disease.  Chronic pleuroparenchymal scarring at the left base.   Original Report Authenticated By: Arnell Sieving, M.D.     Labs: BMET  Lab 03/26/12 0342 03/25/12 2323 03/25/12 1954 03/25/12 0710 03/24/12 2149  NA 131* 129* 132* 134* 134*  K 4.9 5.7* 6.6* 5.4* 5.5*  CL 96 97 98 97 96  CO2 20 16* 20 27 28   GLUCOSE 149* 237* 177* 108* 110*  BUN 43* 42* 39* 37* 34*  CREATININE 4.43* 4.15* 3.99* 3.55* 3.05*  ALB -- -- -- -- --  CALCIUM 9.2 9.0 8.7 9.2 9.5  PHOS -- -- -- -- --   CBC  Lab 03/25/12 1954 03/24/12 2149  WBC 15.9* 10.4  NEUTROABS -- 8.8*  HGB 13.1 12.8  HCT 40.2 38.2  MCV 96.2 93.6  PLT 201 161    Medications:      . aspirin  81 mg Oral Daily  . atorvastatin  40 mg Oral q1800  . azithromycin  500 mg Intravenous  Q24H  . calcium gluconate  1 g Intravenous Once  . cefTRIAXone (ROCEPHIN) IVPB 1 gram/50 mL D5W  1 g Intravenous Q24H  . dextrose  1 ampule Intravenous Once  . influenza  inactive virus vaccine  0.5 mL Intramuscular Tomorrow-1000  . insulin aspart  10 Units Subcutaneous Once  . iohexol  20 mL Oral Q1 Hr x 2  . levothyroxine  26 mcg Intravenous Daily  . pantoprazole (PROTONIX) IV  40 mg Intravenous Q24H  . pneumococcal 23 valent vaccine  0.5 mL Intramuscular Tomorrow-1000  . sodium chloride  1,000 mL Intravenous Once  . sodium chloride  3 mL Intravenous Q12H  . sodium polystyrene  30 g Oral Once  . sodium polystyrene  30 g Rectal Once  . DISCONTD: diltiazem  240 mg Oral Daily  . DISCONTD: feeding supplement  237 mL Oral BID BM  . DISCONTD: levothyroxine  88 mcg Oral Q0600  . DISCONTD: metoprolol succinate  100 mg Oral BID  . DISCONTD: pantoprazole  40 mg Oral BID AC  . DISCONTD: polyethylene glycol  17 g Oral Daily     Assessment/ Plan:   Patient is a 78 y.o. female with a PMHx of mitral stenosis, A fib, L nephrectomy (cancer), who was admitted to [facility] on 03/24/2012 for evaluation of nausea and vomiting with intermittent chest pain for several days.  Acute renal failure -  Worsening renal failure (azotemia, AG=15), but potassium wnl after kayexalate. Baseline Cr appears to be around 1 (1 year prior); Bladder scan yesterday with 0mL & foley placed without urine return.  2nd bladder scan with 12mL.  Foley catheter found to not be in bladder and unable to replace catheter d/t anatomy of pt.  At admission, thought to be prerenal. Nothing significant on renal US or CT abd.  Cautious with fluids as she has diastolic CHF (though EF on 03/25/12 revealed EF 55-65% without wall motion abnormalities) and do not want to precipitate exacerbation. No contrast exposure prior to now and would not use contrast in her. In setting of Afib, patient may have thrown an embolus to her right (only) kidney) - poor  prognosis discussed with daughters. - Continue to treat infection - ceftriaxone, azithromycin (UTI & ?CAP) -will try to contact us to see if we can do a renal artery doppler -Continue to hold ACE-I  -Strict I and Os  -Daily weights  -SPEP/UPEP - pending; urine still not collected (no UO) -protein to creatinine ratio of urine - no UO  Hyperkalemia - Potassium wnl (4.9 this am); continue to watch on telemetry. Could have be d/t ARF & ACE-I.  -Follow BMP   UTI - many bacteria and nitrites and leukocyte esterase on U/A but no treatment initiated.  -Continue ceftriaxone  -Follow culture   Chest pain - Per cardiology and likely non-cardiac etiology. Started protonix.  Atrial fibrillation - In A Fib, on metoprolol for rate control, no coumadin; Per cardiology.   Chronic diastolic heart failure - BNP elevated on admission could be related to acute renal failure rather than CHF. Will continue to follow clinically does not appear to be in exacerbation at this time.   DVT PPX - SCDs   Stacy Gardner, MD 03/26/2012, 7:44 AM   Patient seen and examined and agree with assessment and plan as above. We have scanned the bladder and replaced the foley catheter several times without evidence of urine in the bladder. Also, noncontrast abd CT and renal US do not show distended bladder or hydronephrosis.  Patient has progressive acute anuric renal failure without the usual precipitating factors such as shock, sepsis, etc.. This is uncommon and highly suggestive of a vascular event. In her case, afib with a renal artery embolus to her solitary kidney would be the most plausible explanation. Renal artery dissection is another possibility. Renal infarction would be expected to cause significant pain, which she does have.  Patient declining rapidly. The family and patient have made it clear that they do not want aggressive measures taken (e.g., dialysis, intubation, CPR).  Will see if we can get someone to image  renal artery. Discussed with Dr. Butler Denmark; IV heparin will be started.  Vinson Moselle  MD [facility] [phone] pgr    [phone] cell 03/26/2012, 10:07 AM

## 2012-03-26 NOTE — Progress Notes (Addendum)
ANTICOAGULATION CONSULT NOTE - Initial Consult  Pharmacy Consult for Heparin Indication: atrial fibrillation/kidney infarct  Allergies  Allergen Reactions  . Codeine Other (See Comments)    Reaction unknown  . Hydrocodone Other (See Comments)    Unknown reaction   . Plavix (Clopidogrel Bisulfate)   . Warfarin And Related     Patient Measurements: Height: 5\' 3"  (160 cm) Weight: 153 lb (69.4 kg) IBW/kg (Calculated) : 52.4  Heparin Dosing Weight: 66 kg  Vital Signs: Temp: 97.7 F (36.5 C) (09/15 0728) Temp src: Axillary (09/15 0728) BP: 143/44 mmHg (09/15 0515) Pulse Rate: 58  (09/15 0515)  Labs:  Alvira Philips 03/26/12 0342 03/25/12 2323 03/25/12 1954 03/25/12 1629 03/25/12 1015 03/25/12 0710 03/24/12 2149  HGB -- -- 13.1 -- -- -- 12.8  HCT -- -- 40.2 -- -- -- 38.2  PLT -- -- 201 -- -- -- 161  APTT -- -- -- -- -- -- --  LABPROT -- -- 13.3 -- -- -- --  INR -- -- 0.99 -- -- -- --  HEPARINUNFRC -- -- -- -- -- -- --  CREATININE 4.43* 4.15* 3.99* -- -- -- --  CKTOTAL -- -- 102 -- -- -- --  CKMB -- -- -- -- -- -- --  TROPONINI -- -- -- <0.30 <0.30 <0.30 --    Estimated Creatinine Clearance: 9.9 ml/min (by C-G formula based on Cr of 4.43).   Medical History: Past Medical History  Diagnosis Date  . Permanent atrial fibrillation   . Mitral valve prolapse   . Sinus bradycardia-tachycardia syndrome   . Hemiplegia affecting unspecified side, late effect of cerebrovascular disease   . Hypertension   . Malignant neoplasm of kidney, except pelvis     s/p L nephrectomy  . Hyperlipidemia   . Osteoarthrosis, unspecified whether generalized or localized, unspecified site   . Peptic ulcer, unspecified site, unspecified as acute or chronic, without mention of hemorrhage, perforation, or obstruction   . Hypothyroidism   . Intracranial bleed     h/o right parietal bleed; not a coumadin candidate  . Aortic stenosis     a. echo 1/12: Ef 55-60%, mod LVH, grade 1 diast dysfxn, mod to  severe AS with AVA 0.94 (Vmax) and mean gradient 23 mmHg, PASP 33;   b. echo 6/12: mod LVH, EF 65%, mod AS with mean gradient 28 mmHg, mild to mod MR, mild RAE, no change since 1/12  . 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 Pharmacy is consulted to start IV heparin. Noted pt. Is not on AC PTA (not a coumadin candidate d/t hx of ICH), INR 0.99, INR 0.99, hgb 13.1, plt 201  Goal of Therapy:  Heparin level = 0.3-0.5 Monitor platelets by anticoagulation protocol: Yes   Plan:  - Heparin infusion 900 units/hr, no bolus d/t hx of ICH - F/U 8 hr heparin level at 2000 - daily heparin level and CBC  Bayard Hugger, PharmD, BCPS  Clinical Pharmacist  Pager: [phone]  03/26/2012,11:01 AM

## 2012-03-26 NOTE — Progress Notes (Signed)
Palliative Medicine Team consult requested by Dr Butler Denmark -spoke with staff RN who stated there are orders for Fentanyl and Ativan and patient, at this time seems stable -spoke via room phone with family at bedside -daughter, Carollee Leitz, granddaughter- per granddaughter they are agreeable to meeting, scheduled for first available time, -tomorrow, Monday, 03/27/12 @ 8:30 am.   Janet David, RN 03/26/2012, 7:40 PM Palliative Medicine Team RN Liaison [phone]

## 2012-03-27 DIAGNOSIS — K117 Disturbances of salivary secretion: Secondary | ICD-10-CM

## 2012-03-27 DIAGNOSIS — R4589 Other symptoms and signs involving emotional state: Secondary | ICD-10-CM

## 2012-03-27 DIAGNOSIS — F419 Anxiety disorder, unspecified: Secondary | ICD-10-CM

## 2012-03-27 DIAGNOSIS — N179 Acute kidney failure, unspecified: Secondary | ICD-10-CM

## 2012-03-27 DIAGNOSIS — R52 Pain, unspecified: Secondary | ICD-10-CM

## 2012-03-27 DIAGNOSIS — R451 Restlessness and agitation: Secondary | ICD-10-CM

## 2012-03-27 DIAGNOSIS — F411 Generalized anxiety disorder: Secondary | ICD-10-CM

## 2012-03-27 DIAGNOSIS — K1370 Unspecified lesions of oral mucosa: Secondary | ICD-10-CM

## 2012-03-27 DIAGNOSIS — IMO0002 Reserved for concepts with insufficient information to code with codable children: Secondary | ICD-10-CM

## 2012-03-27 DIAGNOSIS — R112 Nausea with vomiting, unspecified: Secondary | ICD-10-CM

## 2012-03-27 DIAGNOSIS — I359 Nonrheumatic aortic valve disorder, unspecified: Secondary | ICD-10-CM

## 2012-03-27 LAB — URINE CULTURE: Colony Count: >=100000

## 2012-03-27 MED ORDER — ATROPINE SULFATE 1 % OP SOLN
2.0000 [drp] | OPHTHALMIC | Status: DC | PRN
  Filled 2012-03-27: qty 2

## 2012-03-27 MED ORDER — ACETAMINOPHEN 650 MG RE SUPP: 650.0000 mg | RECTAL | Status: DC | PRN

## 2012-03-27 MED ORDER — LORAZEPAM 2 MG/ML IJ SOLN
1.0000 mg | Freq: Two times a day (BID) | INTRAMUSCULAR | Status: DC
  Administered 2012-03-27 – 2012-03-28 (×2): 1 mg via INTRAVENOUS
  Filled 2012-03-27: qty 1

## 2012-03-27 MED ORDER — FENTANYL CITRATE 0.05 MG/ML IJ SOLN
25.0000 ug | INTRAMUSCULAR | Status: DC | PRN
  Administered 2012-03-27 – 2012-03-28 (×13): 25 ug via INTRAVENOUS
  Administered 2012-03-29: 09:00:00 via INTRAVENOUS
  Administered 2012-03-29 (×3): 25 ug via INTRAVENOUS
  Filled 2012-03-27 (×17): qty 2

## 2012-03-27 MED ORDER — SODIUM CHLORIDE 0.9 % IV SOLN
INTRAVENOUS | Status: DC
  Administered 2012-03-27: 14:00:00 via INTRAVENOUS

## 2012-03-27 NOTE — Progress Notes (Signed)
Pt continues to be restless with receiving 12. Fentanyl IV Q2h; family anxious and wanting her to be calm and be able to rest; paged physican/NP on call; Gaylord Shih NP returned paged; see new order; will continue to assess pt and medicate as needed for comfort

## 2012-03-27 NOTE — Progress Notes (Signed)
[facility] Progress Note [place] TEAM 1 - Stepdown/ICU TEAM   [name] JYN:[id number] DOB: 1933/05/11 DOA: 03/24/2012 PCP: Michiel Sites, MD  Brief narrative: 78 year old woman who presented to the emergency room with complaints of not feeling well due to pain in the chest and abdomen. In addition to pain, she was noted to have developed acute renal failure and altered mental status. She had been anuric and continued to complain of pain in the right flank radiating to the right abdomen.  Assessment/Plan:  Acute renal failure in setting of solitary right kidney  no renal calculi or hydronephrosis were visualized on CT and renal duplex is negative for obstruction of the renal artery - Dr Arlean Hopping does not suspect ATN- not a prerenal state as giving IVF did not improve the creatinine- rapidly progressive glomerulonephritis? - family no longer wanting aggressive work up or dialysis - transition to 6700 unit palliative care bed for comfort care only   Right sided flank/abdominal pain  Suspect this is related to her right kidney dysfunction/failure - at this point we will make her comfortable with pain medications  Diastolic CHF  Fluid overloaded on CXR   Alerted mental status- encephalopathy  PRN Ativan requested by family for agitation.   H/o renal cancer and resection of left kidney  Code Status: DNR  Family Communication: spoke with family at bedside  Disposition Plan: transfer to 6700 unit - Palliative Care bed  Consultants: Palliative Care Nephrology  Antibiotics: Rocephin - 9/14- 9/15  Zithromax- 9/15  HPI/Subjective: At the time of my visit the patient appears to be resting comfortably.  Family members are at the bedside and do feel that she is comfortable at this time.  They voiced understanding of the plan to transition to comfort only and support this approach.   Objective: Blood pressure 134/49, pulse 52, temperature 97.5 F (36.4 C), temperature  source Axillary, resp. rate 24, height 5\' 3"  (1.6 m), weight 69.4 kg (153 lb), SpO2 97.00%.  Intake/Output Summary (Last 24 hours) at 03/27/12 1424 Last data filed at 03/27/12 0600  Gross per 24 hour  Intake      0 ml  Output      0 ml  Net      0 ml     Exam: The patient is resting comfortably at the time of my visit.  Her respirations are even and non-labored.  She is not tachycardic or tachypneic.    Data Reviewed: Basic Metabolic Panel:  Lab 03/26/12 8657 03/25/12 2323 03/25/12 1954 03/25/12 0710 03/24/12 2149  NA 131* 129* 132* 134* 134*  K 4.9 5.7* 6.6* 5.4* 5.5*  CL 96 97 98 97 96  CO2 20 16* 20 27 28   GLUCOSE 149* 237* 177* 108* 110*  BUN 43* 42* 39* 37* 34*  CREATININE 4.43* 4.15* 3.99* 3.55* 3.05*  CALCIUM 9.2 9.0 8.7 9.2 9.5  MG -- -- -- -- --  PHOS -- -- -- -- --   Liver Function Tests:  Lab 03/25/12 1954 03/25/12 0252  AST 46* 56*  ALT 30 19  ALKPHOS 85 72  BILITOT 0.4 0.4  PROT 6.5 6.1  ALBUMIN 3.1* 3.0*    Lab 03/25/12 1954 03/25/12 0252  LIPASE 28 23  AMYLASE -- --   CBC:  Lab 03/25/12 1954 03/24/12 2149  WBC 15.9* 10.4  NEUTROABS -- 8.8*  HGB 13.1 12.8  HCT 40.2 38.2  MCV 96.2 93.6  PLT 201 161   Cardiac Enzymes:  Lab 03/25/12 1954 03/25/12  1629 03/25/12 1015 03/25/12 0710 03/24/12 2149  CKTOTAL 102 -- -- -- --  CKMB -- -- -- -- --  CKMBINDEX -- -- -- -- --  TROPONINI -- <0.30 <0.30 <0.30 <0.30   BNP (last 3 results)  Basename 03/24/12 2149  PROBNP 11270.0*   CBG:  Lab 03/25/12 1712  GLUCAP 150*    Recent Results (from the past 240 hour(s))  URINE CULTURE     Status: Normal   Collection Time   03/25/12  4:46 AM      Component Value Range Status Comment   Specimen Description URINE, CLEAN CATCH   Final    Special Requests ADDED ON 03/25/12 AT 1658   Final    Culture  Setup Time 03/25/2012 20:29   Final    Colony Count >=100,000 COLONIES/ML   Final    Culture ESCHERICHIA COLI   Final    Report Status 03/27/2012 FINAL    Final    Organism ID, Bacteria ESCHERICHIA COLI   Final   MRSA PCR SCREENING     Status: Normal   Collection Time   03/25/12  8:31 PM      Component Value Range Status Comment   MRSA by PCR NEGATIVE  NEGATIVE Final      Studies:  Recent x-ray studies have been reviewed in detail by the Attending Physician  Scheduled Meds:  Reviewed in detail by the Attending Physician   Lonia Blood, MD [facility] Office  [phone] Pager [phone]  On-Call/Text Page:      [email or web address]      password [email or web address]  If 7PM-7AM, please contact night-coverage [email or web address] Password [email or web address] 03/27/2012, 2:24 PM   LOS: 3 days

## 2012-03-27 NOTE — Progress Notes (Signed)
Chaplain responded to End of Life consult request. Patient appeared to be asleep.  Per patient's daughter and granddaughter, patient is pretty out of it and has not been talking...she just gets agitated when her medication wears off.  Support provided for patient's family. Will follow up tomorrow.

## 2012-03-27 NOTE — Clinical Social Work Psychosocial (Signed)
     Clinical Social Work Department BRIEF PSYCHOSOCIAL ASSESSMENT 03/27/2012  Patient:  [name], [name]     Account Number:  [id number]     Admit date:  03/24/2012  Clinical Social Worker:  Hulan Fray  Date/Time:  03/27/2012 11:24 AM  Referred by:  Physician  Date Referred:  03/27/2012 Referred for  Residential hospice placement   Other Referral:   Interview type:  Family Other interview type:   daughter- Gavin Pound    PSYCHOSOCIAL DATA Living Status:  ALONE Admitted from facility:   Level of care:   Primary support name:  Gavin Pound Primary support relationship to patient:  CHILD, ADULT Degree of support available:   supportive    CURRENT CONCERNS Current Concerns  Other - See comment   Other Concerns:   Plans are for residential hospice    SOCIAL WORK ASSESSMENT / PLAN Clinical Social Worker received referral for residential hospice placement. CSW introduced self and explained reason for visit. Patient's daughter, son and grand-daughter were at bedside. CSW provided daughter a list of residential hospice facilities and encouraged family to have a back up facility just in case their preferred facility is at capacity. CSW will return tomorrow family's choice on residential hospice facility.   Assessment/plan status:  Psychosocial Support/Ongoing Assessment of Needs Other assessment/ plan:   Information/referral to community resources:   List of residential hospice facilities    PATIENTS/FAMILYS RESPONSE TO PLAN OF CARE: Family was tearful during discussion, but appreciative of CSW's visit and providing residential hospice list. Family was agreeable to CSW returning the next day for confirmation on facility chosen.

## 2012-03-27 NOTE — Progress Notes (Signed)
Patient WU:JWJX [name]      DOB: October 13, 1932      BJY:[id number]  Patient is a 78 yo WF PMH mitral stenosis, Afif, L nephrectomy, admitted to [facility] on 03/24/12 due to nausea/vomiting with intermittent chest pain for several days. Acute renal failure with anuria since admission. Family wishes are not to initiate HD  Patient transferred to 6700, upon arrival patient with increased restlessness and agitation. Last dose of as needed Fentanyl (25 mcg every 2hrs prn) 9:57a. Patient medicated with Fentanyl 1:51p and Ativan initial scheduled initiated dose at 2:27p-patient comfortable. Discussed with family that if scheduled Ativan and as needed dosing of Fentanyl not effective over night ,will consider initiating low dose Fentanyl drip (79mcg/hr), with as needed boluses . They are in agreement, would prefer to have some lucid moments with patient if possible.  Freddie Breech, CNS-C Palliative Medicine Team [facility] Team Phone: [phone] Pager: [phone]

## 2012-03-27 NOTE — Progress Notes (Signed)
Chaplain responded to a call from nurse at 6700. Chaplain visited patient and family members in room [phone]. patient was unresponsive at the time of visit. Chaplain shared words of encouragement and comfort with family members. Chaplain prayed with family members. Family member expressed their appreciation for Chaplain's visit. Chaplain will continue to provide spiritual care to both patient and family members as needed at a later time.

## 2012-03-27 NOTE — Progress Notes (Signed)
Brief Nutrition Note  Chart reviewed. Pt now transitioning to comfort care.  No further nutrition interventions warranted at this time.  Please re-consult as needed.   Henri Baumler Kowalski RD, LDN Pager #[phone] After Hours pager #[phone]    

## 2012-03-27 NOTE — Consult Note (Signed)
Patient [name]      DOB: 1932-09-07      WJX:[id number]     Consult Note from the Palliative Medicine Team at [facility]    Consult Requested by: Dr Butler Denmark     PCP: Janet Sites, MD Reason for Consultation:Goals of Care EOL     Phone Number:[phone]  Assessment of patients Current state:  Patient lethargic does not open eyes to verbal/stimuli, does respond with guttural sounds. Calm presently, dose get restless when repositioned. Respiratory rate 24/min, not labored.   Reviewed chart, spoke with staff caring for patient proceeded to have family meeting with patients daughter [name] and [name], patients niece. Per daughter patient has 2 other sons that are marginally involved in care, she is the primary decision maker for mothers health care. Daughter stated that mother was independent and living alone prior to this admission. She also indicated that she had conversations with mother about her wishes for aggressive medical interventions such as dialysis  in past. Mother did not want aggressive measures to prolong life.   We discussed the expected trajectory of patients demise without dialysis, and how symptoms could be managed to ensure patients comfort. Daughter verbalized that she would want any medications or measures not in line with comfort approach discontinued.  Reviewed current orders with daughter, had concerns about IV fluid causing added discomfort, informed her that rate will be maintained at minimal rate (10 ml/hr )for IV medication administration. Family is agreeable for transfer to 6700, and has requested hospice choice for residential placement.    Goals of Care: 1.  Code Status:DNR/DNI   2. Scope of Treatment: 1. Vital Signs: Routine  2. Respiratory/Oxygen: supplemental oxygen-respiratory treatments as indicated for comfort 3. Nutritional Support/Tube Feeds: no feeding tubes, maintain on NPO status with frequent mouth  care 4. Antibiotics: no further use of antibiotics 5. ONG:EXBMWUXL at [facility] rate 73ml/hr 6. Labs: no further lab draws 7. Telemetry: no 8. Consults: spiritual and social work   4. Disposition: Family requesting hospice residential placement-social work contacted to offer choice   3. Symptom Management:   1. Anxiety/Agitation: Ativan scheduled every 12 hrs,  With as needed dosing 2. Pain: currently on Fentanyl IV as needed-dose increased in past 24 hrs seemsto be effective presently- will consider initiating Fentanyl drip to maintain comfort if as needed dosing not effective. Per daughter patient has sever nausea and vomiting with Morphine and Codeine. 3. Bowel Regimen: Dulcolax suppository as needed 4. Fever:Tylenol suppository as needed 5. Nausea/Vomiting: ondansteron as needed 6. Terminal Secretions: Atropine SL as needed  4. Psychosocial:Emotional support to daughter and neice  5. Spiritual:spiritual consult placed   Patient Documents Completed or Given: Document Given Completed  Advanced Directives Pkt    MOST  YES  DNR  YES  Gone from My Sight    Hard Choices  YES    Brief HPI: Patient is a 78 yo WF PMH mitral stenosis, Afif, L nephrectomy, admitted to [facility] on 03/24/12 due to nausea/vomiting with intermittent chest pain for several days. Acute renal failure with anuria since admission. Family wishes are not to initiate HD    ROS: unable to elicit from patient    PMH:  Past Medical History  Diagnosis Date  . Permanent atrial fibrillation   . Mitral valve prolapse   . Sinus bradycardia-tachycardia syndrome   . Hemiplegia affecting unspecified side, late effect of cerebrovascular disease   . Hypertension   . Malignant neoplasm of kidney, except pelvis  s/p L nephrectomy  . Hyperlipidemia   . Osteoarthrosis, unspecified whether generalized or localized, unspecified site   . Peptic ulcer, unspecified site, unspecified as acute or chronic, without mention of  hemorrhage, perforation, or obstruction   . Hypothyroidism   . Intracranial bleed     h/o right parietal bleed; not a coumadin candidate  . Aortic stenosis     a. echo 1/12: Ef 55-60%, mod LVH, grade 1 diast dysfxn, mod to severe AS with AVA 0.94 (Vmax) and mean gradient 23 mmHg, PASP 33;   b. echo 6/12: mod LVH, EF 65%, mod AS with mean gradient 28 mmHg, mild to mod MR, mild RAE, no change since 1/12  . Chest pain     Myoview 6/12: no ischemia, EF 64%     PSH: Past Surgical History  Procedure Date  . Tonsillectomy   . Back surgery     degenerative joint disease  . Nephrectomy    I have reviewed the FH and SH and  If appropriate update it with new information. Allergies  Allergen Reactions  . Codeine Other (See Comments)    Reaction unknown  . Hydrocodone Other (See Comments)    Unknown reaction   . Plavix (Clopidogrel Bisulfate)   . Warfarin And Related    Scheduled Meds:   . furosemide  160 mg Intravenous Once  . levothyroxine  26 mcg Intravenous Daily  . sodium chloride  3 mL Intravenous Q12H  . sodium polystyrene  30 g Rectal Once  . DISCONTD: aspirin  81 mg Oral Daily  . DISCONTD: influenza  inactive virus vaccine  0.5 mL Intramuscular Tomorrow-1000  . DISCONTD: methylPREDNISolone (SOLU-MEDROL) injection  250 mg Intravenous Q24H  . DISCONTD: pantoprazole (PROTONIX) IV  40 mg Intravenous Q24H  . DISCONTD: pneumococcal 23 valent vaccine  0.5 mL Intramuscular Tomorrow-1000   Continuous Infusions:   . sodium chloride 150 mL/hr at 03/25/12 1642  . DISCONTD: heparin 900 Units/hr (03/26/12 1300)   PRN Meds:.acetaminophen, bisacodyl, fentaNYL, LORazepam, LORazepam, ondansetron (ZOFRAN) IV, ondansetron, DISCONTD: fentaNYL, DISCONTD: meperidine (DEMEROL) injection    BP 134/49  Pulse 52  Temp 97.5 F (36.4 C) (Axillary)  Resp 24  Ht 5\' 3"  (1.6 m)  Wt 69.4 kg (153 lb)  BMI 27.10 kg/m2  SpO2 97%   PPS: 10%   Intake/Output Summary (Last 24 hours) at 03/27/12  1146 Last data filed at 03/27/12 0600  Gross per 24 hour  Intake     35 ml  Output      0 ml  Net     35 ml   LBM: prior to admission                     Physical Exam:  General: Obtuded, eyes closed HEENT: eyes closed, buccal mucosa dry Chest:   CTA bilaterally, diminished at bases, respirations unlabored, no accessory muscle use or paradoxical breathing noted CVS: RRR, no MGR Abdomen:soft, non-distended, non-tender, no guarding upon palpation Ext: cool to touch, some diffuse mottling toes, soles of feet and fingertips Neuro:non-responsive  Labs: CBC    Component Value Date/Time   WBC 15.9* 03/25/2012 1954   RBC 4.18 03/25/2012 1954   HGB 13.1 03/25/2012 1954   HCT 40.2 03/25/2012 1954   PLT 201 03/25/2012 1954   MCV 96.2 03/25/2012 1954   MCH 31.3 03/25/2012 1954   MCHC 32.6 03/25/2012 1954   RDW 14.4 03/25/2012 1954   LYMPHSABS 0.7 03/24/2012 2149   MONOABS 0.8 03/24/2012 2149  EOSABS 0.0 03/24/2012 2149   BASOSABS 0.0 03/24/2012 2149    BMET    Component Value Date/Time   NA 131* 03/26/2012 0342   K 4.9 03/26/2012 0342   CL 96 03/26/2012 0342   CO2 20 03/26/2012 0342   GLUCOSE 149* 03/26/2012 0342   BUN 43* 03/26/2012 0342   CREATININE 4.43* 03/26/2012 0342   CALCIUM 9.2 03/26/2012 0342   GFRNONAA 9* 03/26/2012 0342   GFRAA 10* 03/26/2012 0342    CMP     Component Value Date/Time   NA 131* 03/26/2012 0342   K 4.9 03/26/2012 0342   CL 96 03/26/2012 0342   CO2 20 03/26/2012 0342   GLUCOSE 149* 03/26/2012 0342   BUN 43* 03/26/2012 0342   CREATININE 4.43* 03/26/2012 0342   CALCIUM 9.2 03/26/2012 0342   PROT 6.5 03/25/2012 1954   ALBUMIN 3.1* 03/25/2012 1954   AST 46* 03/25/2012 1954   ALT 30 03/25/2012 1954   ALKPHOS 85 03/25/2012 1954   BILITOT 0.4 03/25/2012 1954   GFRNONAA 9* 03/26/2012 0342   GFRAA 10* 03/26/2012 0342      Time In Time Out Total Time Spent with Patient Total Overall Time  8:30a 10:00a 60 min 90 min    Greater than 50%  of this time was spent counseling  and coordinating care related to the above assessment and plan.  [name], CNS-C Palliative Medicine Team [facility] Team Phone: [phone] Pager: [phone]

## 2012-03-27 NOTE — Progress Notes (Signed)
Patient [name]      DOB: 1933/04/23      ZHY:[id number]  Received a call from nurse caring for [name].  Patient had received Ativan and Fentanyl around 6pm with reported good effects until approximately 8 pm when next dose was due and was given.  Patient currently due for ativan and Fentanyl.  Nursing reports patient "fidgeting but not excessively agitated".  Family was requesting to increase fentanyl dose instead of moving on to continuous opiate delivery as discussed by PMT day team.  At this time, patient seems to be responding to current schedule of medications.  Family will be reassured by nursing regarding current schedule and nurse may contact me further if her symptoms do not seem controlled.  Hadrian Yarbrough L. Ladona Ridgel, MD MBA The Palliative Medicine Team at [facility] Phone: [phone] Pager: [phone]

## 2012-03-27 NOTE — Progress Notes (Signed)
Family placed dentures in patient belongings bag during transport.

## 2012-03-27 NOTE — Care Management Note (Signed)
    Page 1 of 1   03/27/2012     11:47:38 AM   CARE MANAGEMENT NOTE 03/27/2012  Patient:  [name], [name]   Account Number:  [id number]  Date Initiated:  03/27/2012  Documentation initiated by:  Junius Creamer  Subjective/Objective Assessment:   adm w renal failure,     Action/Plan:   lives alone, pcp dr d Juleen China   Anticipated DC Date:     Anticipated DC Plan:  [facility]  In-house referral  Clinical Social Worker  Hospice / Palliative Care      DC Planning Services  CM consult      Choice offered to / List presented to:             Status of service:   Medicare Important Message given?   (If response is "NO", the following Medicare IM given date fields will be blank) Date Medicare IM given:   Date Additional Medicare IM given:    Discharge Disposition:    Per UR Regulation:  Reviewed for med. necessity/level of care/duration of stay  If discussed at Long Length of Stay Meetings, dates discussed:    Comments:  9/16 11:45a debbie Janes [place] rn,bns [phone]

## 2012-03-27 NOTE — Progress Notes (Signed)
Patient ZO:XWRU BREEZIE MICUCCI      DOB: [birth year]      [id number]  Ptaient is a 78 yo WF PMH mitral stenosis, Afif, L nephrectomy, admitted to [facility] on 03/24/12 due to nausea/vomiting with intermittent chest pain for several days. Acute renal failure with anuria since admission. Family wishes are not to initiate HD.  PMT family meeting held this morning, family wishes are for total comfort care with transition to residential hospice. Spoke with Dr Sharon Seller was agreeable for transfer to 6700. Orders placed. Full note to follow  Freddie Breech, CNS-C Palliative Medicine Team [facility] Team Phone: [phone] Pager: [phone]

## 2012-03-27 NOTE — Progress Notes (Signed)
[facility] Progress Note    Subjective:   Patient is asleep, does not awake. Daughters are at bedside.  Now have decided comfort care only   Objective:   BP 134/49  Pulse 52  Temp 98.2 F (36.8 C) (Oral)  Resp 24  Ht 5\' 3"  (1.6 m)  Wt 153 lb (69.4 kg)  BMI 27.10 kg/m2  SpO2 97%  Intake/Output Summary (Last 24 hours) at 03/27/12 2725 Last data filed at 03/27/12 0600  Gross per 24 hour  Intake     65 ml  Output      0 ml  Net     65 ml   Weight change:   Physical Exam: Gen: sleeping, does not awake CVS: irregular rhythm  Resp:CTA b/l Abd: soft Ext: no pedal edema, warm, well perfused  Imaging: Ct Abdomen Pelvis Wo Contrast  03/26/2012  *RADIOLOGY REPORT*  Clinical Data: Abdominal pain, acute renal failure.  CT ABDOMEN AND PELVIS WITHOUT CONTRAST  Technique:  Multidetector CT imaging of the abdomen and pelvis was performed following the standard protocol without intravenous contrast.  Comparison: 03/25/2012 ultrasound  Findings: Coronary artery and aortic valve calcifications.  Heart size upper normal to mildly enlarged.  Trace pericardial fluid. Bilateral pleural effusions, right greater than left with associated consolidations; atelectasis versus pneumonia.  Organ abnormality/lesion detection is limited in the absence of intravenous contrast. Within this limitation, punctate calcifications within the liver and spleen are in keeping with sequelae of granulomas infection.  Distended gallbladder with high attenuation fluid.  No biliary ductal dilatation.  Right renal contours mildly lobular with a nonspecific subcentimeter hypodensity. No hydronephrosis or hydroureter.  Absent left kidney.  Small amount of fluid attenuation along the posterior pararenal space is nonspecific.  There is contrast within the colon, presumably from an outside prior examination.  Colonic diverticulosis.  No definite evidence for diverticulitis.  Motion degraded mid abdominal images.   Normal appendix.  No free intraperitoneal air or fluid.  No lymphadenopathy.  Advanced atherosclerosis of the aorta and branch vessels.  Further vascular evaluation not possible without intravenous contrast.  Decompressed bladder.  Unremarkable CT appearance to the uterus and adnexa.  Multilevel degenerative changes.  No acute osseous finding.  There is leftward curvature of the spine.  IMPRESSION: Bilateral pleural effusions and associated consolidations; atelectasis versus pneumonia.  High attenuation within the gallbladder presumably represents vicarious excretion. Contrast within the colon suggests a recent prior exam.  Status post left nephrectomy.  There is a small amount nonspecific fluid attenuation in the surgical bed.  The right kidney has a mildly lobular contour.  No hydronephrosis or hydroureter.  Advanced atherosclerotic disease of the aorta and branch vessels. Further vascular evaluation is not possible without intravenous contrast.   Original Report Authenticated By: Waneta Martins, M.D.    Dg Chest Port 1 View  03/26/2012  *RADIOLOGY REPORT*  Clinical Data: Rhonchi at physical examination.  Volume overload.  PORTABLE CHEST - 1 VIEW 03/25/2012 2053 hours:  Comparison: Portable chest x-ray yesterday and 05/15/2011.  Findings: Interval development of interstitial and airspace opacities throughout the right lung and, to a lesser degree, the left lung since the examination yesterday.  Bilateral pleural effusions, increased in size.  Cardiac silhouette mildly enlarged but stable.  IMPRESSION: Asymmetric pulmonary edema, right greater than left, new since yesterday.  Enlarging bilateral pleural effusions.   Original Report Authenticated By: Arnell Sieving, M.D.     Labs: BMET  Lab 03/26/12 [phone] 03/25/12 4034 03/25/12 1954 03/25/12 0710  03/24/12 2149  NA 131* 129* 132* 134* 134*  K 4.9 5.7* 6.6* 5.4* 5.5*  CL 96 97 98 97 96  CO2 20 16* 20 27 28   GLUCOSE 149* 237* 177* 108* 110*  BUN  43* 42* 39* 37* 34*  CREATININE 4.43* 4.15* 3.99* 3.55* 3.05*  ALB -- -- -- -- --  CALCIUM 9.2 9.0 8.7 9.2 9.5  PHOS -- -- -- -- --   CBC  Lab 03/25/12 1954 03/24/12 2149  WBC 15.9* 10.4  NEUTROABS -- 8.8*  HGB 13.1 12.8  HCT 40.2 38.2  MCV 96.2 93.6  PLT 201 161    Medications:       . furosemide  160 mg Intravenous Once  . levothyroxine  26 mcg Intravenous Daily  . sodium chloride  3 mL Intravenous Q12H  . sodium polystyrene  30 g Rectal Once  . DISCONTD: aspirin  81 mg Oral Daily  . DISCONTD: atorvastatin  40 mg Oral q1800  . DISCONTD: azithromycin  500 mg Intravenous Q24H  . DISCONTD: cefTRIAXone (ROCEPHIN) IVPB 1 gram/50 mL D5W  1 g Intravenous Q24H  . DISCONTD: heparin  3,000 Units Intravenous Once  . DISCONTD: influenza  inactive virus vaccine  0.5 mL Intramuscular Tomorrow-1000  . DISCONTD: methylPREDNISolone (SOLU-MEDROL) injection  250 mg Intravenous Q24H  . DISCONTD: pantoprazole (PROTONIX) IV  40 mg Intravenous Q24H  . DISCONTD: pneumococcal 23 valent vaccine  0.5 mL Intramuscular Tomorrow-1000     Assessment/ Plan:   Patient is a 78 y.o. female with a PMHx of mitral stenosis, A fib, L nephrectomy (cancer), who was admitted to [facility] on 03/24/2012 for evaluation of nausea and vomiting with intermittent chest pain for several days.  Acute renal failure - Worsening renal failure as of yesterday with (azotemia, AG=15), and potassium wnl after kayexalate. No new labs have been collected today for comparison. Baseline Cr appears to be around 1 (1 year prior); Bladder scan showed 0mL & foley placed without urine return. (2nd attempt to place foley failed due to pt anatomy)At admission, thought to be prerenal. Nothing significant on renal US or CT abd. Been cautious with fluids as she has diastolic CHF (though EF on 03/25/12 revealed EF 55-65% without wall motion abnormalities) and do not want to precipitate exacerbation. At this time, family is requesting no IV fluids. No  contrast exposure prior to now and would not use contrast in her. In setting of Afib, patient may have thrown an embolus to her right (only) kidney) - poor prognosis discussed with daughters. - Renal doppler shows + flow in R renal artery with high resistive index - Continue to hold ACE-I  - Strict I and Os, although she has not had any urine output - Daily weights  - SPEP/UPEP - ordered but pending; urine still not collected (no UO) - protein to creatinine ratio of urine - no UO  Hyperkalemia - Potassium wnl (4.9 yesterday); continue to watch on telemetry. Could have be d/t ARF & ACE-I.  -Follow BMP, if family wishes to continue lab draws   UTI - many bacteria and nitrites and leukocyte esterase on U/A but no treatment initiated at admission.  - Urine culture shows >100k E.coli - Family is choosing no antibiotics at this time  Chest pain - Per cardiology and likely non-cardiac etiology. Started protonix.  Atrial fibrillation - In A Fib, on metoprolol for rate control, no coumadin; Per cardiology.   Chronic diastolic heart failure - BNP elevated on admission could be related to  acute renal failure rather than CHF. Will continue to follow clinically does not appear to be in exacerbation at this time.   DVT PPX - None  Dispo- Patient has a poor prognosis with progressive uremia and no UOP. Family wishes to not proceed with HD at this point due to patient's prior wishes. They also do not want excessive fluids, medications or antibiotics. Palliative Care consult pending at this time; will follow up note for further recommendations.  Rodman Pickle, MD 03/27/2012, 8:28 AM   Patient seen and examined, agree with above note with above modifications. Patient with solitary kidney and now A on CRF that is anuric and probably not reversible.  Family has decided on total palliative care with no HD.  Renal will sign off, call if we can assist.  Annie Sable, MD 03/27/2012

## 2012-03-28 DIAGNOSIS — R451 Restlessness and agitation: Secondary | ICD-10-CM

## 2012-03-28 LAB — PROTEIN ELECTROPHORESIS, SERUM
Albumin ELP: 54.6 % — ABNORMAL LOW (ref 55.8–66.1)
Alpha-1-Globulin: 7.6 % — ABNORMAL HIGH (ref 2.9–4.9)
Alpha-2-Globulin: 13.4 % — ABNORMAL HIGH (ref 7.1–11.8)
Beta Globulin: 7.3 % — ABNORMAL HIGH (ref 4.7–7.2)
Total Protein ELP: 5.4 g/dL — ABNORMAL LOW (ref 6.0–8.3)

## 2012-03-28 MED ORDER — LORAZEPAM 2 MG/ML IJ SOLN
1.0000 mg | Freq: Three times a day (TID) | INTRAMUSCULAR | Status: DC
  Administered 2012-03-28 – 2012-03-29 (×4): 1 mg via INTRAVENOUS
  Filled 2012-03-28 (×3): qty 1

## 2012-03-28 NOTE — Progress Notes (Signed)
[name]      DOB: 02-26-1933      EAV:[id number]   Palliative Medicine Team at [facility] Progress Note    Subjective:Assessed Janet at 8:15a today, last dose of Fentanyl 5:39a along with as needed Ativan 1 mg.   Janet resting comfortably in bed, respirations 24/min. Per family Janet gets restless when care is rendered, otherwise she rests well.  Filed Vitals:   03/27/12 2057  BP: 137/82  Pulse: 113  Temp: 98.3 F (36.8 C)  Resp: 22   Physical exam: General: Lying quietly in bed with eyes closed, obtuded HEENT: mouth and lips dry CHEST: CTA bilaterally diminished at bases CVS: RRR, no audible MGR ABD: soft, non-tender, BS audible EXT: mottling  NEURO: moves extremities non-purposely when touched or moved, does not open eyes or speak  Assessment and plan: Janet is a 78 yo WF PMH mitral stenosis, Afif, L nephrectomy, admitted to [facility] on 03/24/12 due to nausea/vomiting with intermittent chest pain for several days. Acute renal failure with anuria since admission. Family wishes are not to initiate HD  Becomes mildly restless when care is rendered otherwise Janet appears comfortable.  1) Agitation: will schedule Ativan every 8 hrs-with as needed dosing every 4 hrs 2) Pain/Discomfort: Fentanyl 25 mcg scheduled as needed every 2 hrs 3) Terminal secretions: Atropine as needed 4) plan is for residential hospice placement, contacted social work Erie Noe Crawford)choice has been offered per family needs to convey choices to social work on 6700  Time In Time Out Total Time Spent with Janet Total Overall Time  8:15a 8:45a 20 min 30 min    Greater than 50%  of this time was spent counseling and coordinating care related to the above assessment and plan.  [name], CNS-C Palliative Medicine Team [facility] Team Phone: [phone] Pager: [phone]

## 2012-03-28 NOTE — Progress Notes (Signed)
Patient ZO:XWRU [name]      DOB: March 01, 1933      EAV:[id number]  Patient resting comfortably with additional scheduled dose of Ativan, family at bedside.  Freddie Breech, CNS-C Palliative Medicine Team [facility] Team Phone: [phone] Pager: [phone]

## 2012-03-28 NOTE — Progress Notes (Signed)
Pt seen and examined, chart reviewed transferred from Team 8 to floor today Here w/ ARF, Solitary kidney, encephalopathy Unresponsive, comfortable per family Continue comfort care per Palliative medicine CSW to see for Residential Hospice placement  Zannie Cove, MD [phone]

## 2012-03-28 NOTE — Consult Note (Signed)
[facility] Liaison: Received request from CSW Erie Noe for family interest in [facility]. Chart reviewed and received report from Palliative Medicine Team NP. Met with daughter Gavin Pound, son Chrissie Noa and three other family members. Explained [facility] philosophy of care, acknowledged fears and concerns, affirmed family devotion and caregiving. All in agreement with transfer to [facility] 03/29/2012. CSW Erie Noe and [facility] aware of plan. CSW please fax discharge summary to [phone] and RN please call report to [phone]. Will follow up in AM. Thank you. Forrestine Him LCSW [phone]

## 2012-03-28 NOTE — Progress Notes (Signed)
Palliative Medicine Team SW Referred for psychosocial support. Met with several of pt's family members at bedside, pt unresponsive, comfortable. Family discussed their anxiety throughout pt's EOL process. Encouraged self-care, taking shifts, etc. Family report pt's symptoms appear improved since the weekend. Family expressed the difficulty with the "unknown" and "waiting". Family report discussion with unit CSW regarding d/c needs. Family appreciative of support, have my contact info as needs arise.   [name], [place] Pager [phone]

## 2012-03-28 NOTE — Clinical Social Work Note (Signed)
03/28/12 - CSW talked with patient's daughter, Carollee Leitz ([phone]) regarding their Residential Hospice placement. The family's choices are in order: [facility], [facility] and the Hospice facility in [place]. Call made to Forrestine Him, SW at [facility] & per Carley Hammed no beds today and she is not sure about Wednesday.  CSW contacted [facility] and spoke with Lafonda Mosses regarding patient and they do have beds today. CSW later was advised by [facility] SW that they will definitely be able to take patient on Wednesday. Family notified and admissions paperwork completed.  03/29/12 - Patient discharged today to [facility] today. CSW facilitated transport to Hospice facility via ambulance.  Genelle Bal, MSW, LCSW [phone]

## 2012-03-29 DIAGNOSIS — I10 Essential (primary) hypertension: Secondary | ICD-10-CM

## 2012-03-29 DIAGNOSIS — I5032 Chronic diastolic (congestive) heart failure: Secondary | ICD-10-CM

## 2012-03-29 MED ORDER — ATROPINE SULFATE 1 % OP SOLN: OPHTHALMIC | Status: DC

## 2012-03-29 MED ORDER — LORAZEPAM 2 MG/ML IJ SOLN: 1.0000 mg | INTRAMUSCULAR | Status: DC | PRN

## 2012-03-29 MED ORDER — ACETAMINOPHEN 650 MG RE SUPP: 650.0000 mg | RECTAL | Status: DC | PRN

## 2012-03-29 MED ORDER — FENTANYL CITRATE 0.05 MG/ML IJ SOLN: 25.0000 ug | INTRAMUSCULAR | Status: DC | PRN

## 2012-03-29 NOTE — Discharge Summary (Signed)
Physician Discharge Summary  [name]:[id number] DOB: [birth year] DOA: 03/24/2012  PCP: Michiel Sites, MD  Admit date: 03/24/2012 Discharge date: 03/29/2012  Recommendations for Outpatient Follow-up:  1. None  Discharge Diagnoses:  Principal Problem:  *Acute renal failure Active Problems:  RENAL CELL CANCER  HYPOTHYROIDISM  HYPERLIPIDEMIA  HYPERTENSION  Atrial fibrillation  CEREBROVASCULAR ACCIDENT WITH RIGHT HEMIPARESIS  PUD  Aortic stenosis  GERD (gastroesophageal reflux disease)  Nausea and vomiting in adult  Chest pain  Chronic diastolic heart failure  Hyperkalemia  Encephalopathy acute  Abdominal pain  Anxiety  Restlessness and agitation  Pain  Increased oropharyngeal secretions  Agitation   Discharge Condition: Stable  Diet recommendation: NPO, may have comfort feeds if patient wants.  Filed Weights   03/25/12 0439 03/25/12 1924 03/26/12 0500  Weight: 63.3 kg (139 lb 8.8 oz) 67.9 kg (149 lb 11.1 oz) 69.4 kg (153 lb)    History of present illness:  78 year old woman who presented to the emergency room with complaints of not feeling well due to pain in the chest and abdomen. In addition to pain, she was noted to have developed acute renal failure and altered mental status. She had been anuric and continued to complain of pain in the right flank radiating to the right abdomen.  Hospital Course:  Acute renal failure in setting of solitary right kidney  No renal calculi or hydronephrosis were visualized on CT and renal duplex is negative for obstruction of the renal artery. Renal, Dr Arlean Hopping, does not suspect ATN. Not a prerenal state as giving IVF did not improve the creatinine, rapidly progressive glomerulonephritis? Family no longer wanting aggressive work up or dialysis, family wants comfort care only at this time.   Right sided flank/abdominal pain  Suspect this is related to her right kidney dysfunction/failure, at this point we will make her  comfortable with pain medications.   Diastolic CHF  Fluid overloaded on CXR.   Alerted mental status- encephalopathy  PRN Ativan requested by family for agitation.   H/o renal cancer and resection of left kidney   Acute on chronic diastolic heart failure EF 55-65% on 03/25/2012. Comfort care only at this time.  Code Status: DNR  Disposition Plan: Transfer to [facility] place at discharge.  Consultants:  Palliative Care  Nephrology   Antibiotics:  Rocephin - 9/14- 9/15  Zithromax- 9/15  Discharge Exam: Filed Vitals:   03/26/12 2029 03/27/12 0800 03/27/12 2057 03/28/12 2135  BP:   137/82 129/71  Pulse:   113 124  Temp:    95.7 F (35.4 C)  TempSrc: Oral Axillary Axillary Oral  Resp:   22 16  Height:      Weight:      SpO2:   95% 95%    Discharge Instructions  Discharge Orders    Future Orders Please Complete By Expires   Increase activity slowly          Medication List     As of 03/29/2012  8:43 AM    STOP taking these medications         amitriptyline 25 MG tablet   Commonly known as: ELAVIL      aspirin 81 MG tablet      CARTIA XT 240 MG 24 hr capsule   Generic drug: diltiazem      diclofenac sodium 1 % Gel   Commonly known as: VOLTAREN      levothyroxine 88 MCG tablet   Commonly known as: SYNTHROID, LEVOTHROID  lisinopril 40 MG tablet   Commonly known as: PRINIVIL,ZESTRIL      metoprolol succinate 100 MG 24 hr tablet   Commonly known as: TOPROL-XL      pantoprazole 40 MG tablet   Commonly known as: PROTONIX      promethazine 12.5 MG tablet   Commonly known as: PHENERGAN      rosuvastatin 40 MG tablet   Commonly known as: CRESTOR      traMADol 50 MG tablet   Commonly known as: ULTRAM      TAKE these medications         acetaminophen 650 MG suppository   Commonly known as: TYLENOL   Place 1 suppository (650 mg total) rectally every 4 (four) hours as needed.      atropine 1 % ophthalmic solution   Place 2 drops under the tongue  every 4 (four) hours as needed (terminal secretions)      fentaNYL 0.05 MG/ML injection   Commonly known as: SUBLIMAZE   Inject 0.5 mLs (25 mcg total) into the vein every 2 (two) hours as needed for severe pain.      LORazepam 2 MG/ML injection   Commonly known as: ATIVAN   Inject 0.5 mLs (1 mg total) into the vein every 4 (four) hours as needed for anxiety (for severe agitation).          The results of significant diagnostics from this hospitalization (including imaging, microbiology, ancillary and laboratory) are listed below for reference.    Significant Diagnostic Studies: Ct Abdomen Pelvis Wo Contrast  03/26/2012  *RADIOLOGY REPORT*  Clinical Data: Abdominal pain, acute renal failure.  CT ABDOMEN AND PELVIS WITHOUT CONTRAST  Technique:  Multidetector CT imaging of the abdomen and pelvis was performed following the standard protocol without intravenous contrast.  Comparison: 03/25/2012 ultrasound  Findings: Coronary artery and aortic valve calcifications.  Heart size upper normal to mildly enlarged.  Trace pericardial fluid. Bilateral pleural effusions, right greater than left with associated consolidations; atelectasis versus pneumonia.  Organ abnormality/lesion detection is limited in the absence of intravenous contrast. Within this limitation, punctate calcifications within the liver and spleen are in keeping with sequelae of granulomas infection.  Distended gallbladder with high attenuation fluid.  No biliary ductal dilatation.  Right renal contours mildly lobular with a nonspecific subcentimeter hypodensity. No hydronephrosis or hydroureter.  Absent left kidney.  Small amount of fluid attenuation along the posterior pararenal space is nonspecific.  There is contrast within the colon, presumably from an outside prior examination.  Colonic diverticulosis.  No definite evidence for diverticulitis.  Motion degraded mid abdominal images.  Normal appendix.  No free intraperitoneal air or  fluid.  No lymphadenopathy.  Advanced atherosclerosis of the aorta and branch vessels.  Further vascular evaluation not possible without intravenous contrast.  Decompressed bladder.  Unremarkable CT appearance to the uterus and adnexa.  Multilevel degenerative changes.  No acute osseous finding.  There is leftward curvature of the spine.  IMPRESSION: Bilateral pleural effusions and associated consolidations; atelectasis versus pneumonia.  High attenuation within the gallbladder presumably represents vicarious excretion. Contrast within the colon suggests a recent prior exam.  Status post left nephrectomy.  There is a small amount nonspecific fluid attenuation in the surgical bed.  The right kidney has a mildly lobular contour.  No hydronephrosis or hydroureter.  Advanced atherosclerotic disease of the aorta and branch vessels. Further vascular evaluation is not possible without intravenous contrast.   Original Report Authenticated By: Waneta Martins, M.D.  Ct Head Wo Contrast  03/25/2012  *RADIOLOGY REPORT*  Clinical Data: Headache  CT HEAD WITHOUT CONTRAST  Technique:  Contiguous axial images were obtained from the base of the skull through the vertex without contrast.  Comparison: 03/26/2011  Findings: Several slices are mildly degraded by patient motion/streak artifact.  Allowing for this, periventricular and subcortical white matter hypodensities are most in keeping with chronic microangiopathic change.  Atherosclerotic vascular calcifications.  No hydrocephalous.  No definite intraparenchymal hemorrhage, mass, mass effect, or abnormal extra-axial fluid collection.  No definite CT evidence of acute infarction. The visualized paranasal sinuses and mastoid air cells are predominately clear. Left maxillary fibrous dysplasia again noted.  IMPRESSION: White matter changes.  No definite acute intracranial abnormality.   Original Report Authenticated By: Waneta Martins, M.D.    US Renal  03/25/2012   *RADIOLOGY REPORT*  Clinical Data: Acute renal failure, history of left nephrectomy.  RENAL/URINARY TRACT ULTRASOUND COMPLETE  Comparison:  None.  Findings:  Right Kidney:  Measures 10.5 cm.  Normal echogenicity.  No hydronephrosis.  Left Kidney:  Absent  Bladder:  Decompressed  IMPRESSION: Normal sonographic appearance to the right kidney.  Status post left nephrectomy.   Original Report Authenticated By: Waneta Martins, M.D.    Dg Chest Port 1 View  03/26/2012  *RADIOLOGY REPORT*  Clinical Data: Rhonchi at physical examination.  Volume overload.  PORTABLE CHEST - 1 VIEW 03/25/2012 2053 hours:  Comparison: Portable chest x-ray yesterday and 05/15/2011.  Findings: Interval development of interstitial and airspace opacities throughout the right lung and, to a lesser degree, the left lung since the examination yesterday.  Bilateral pleural effusions, increased in size.  Cardiac silhouette mildly enlarged but stable.  IMPRESSION: Asymmetric pulmonary edema, right greater than left, new since yesterday.  Enlarging bilateral pleural effusions.   Original Report Authenticated By: Arnell Sieving, M.D.    Dg Chest Port 1 View  03/24/2012  *RADIOLOGY REPORT*  Clinical Data: Arrhythmia.  History of hypertension.  PORTABLE CHEST - 1 VIEW 03/24/2012 2201 hours:  Comparison: Two-view chest x-ray 05/16/2011.  Findings: Suboptimal inspiration accounts for crowded bronchovascular markings, especially in the lung bases, and accentuates the cardiac silhouette.  Taking this into account, cardiac silhouette normal in size for AP portable technique. Chronic pleuroparenchymal scarring at the left base with blunting of the left costophrenic angle.  Lungs otherwise clear.  IMPRESSION: No acute cardiopulmonary disease.  Chronic pleuroparenchymal scarring at the left base.   Original Report Authenticated By: Arnell Sieving, M.D.     Microbiology: Recent Results (from the past 240 hour(s))  URINE CULTURE     Status:  Normal   Collection Time   03/25/12  4:46 AM      Component Value Range Status Comment   Specimen Description URINE, CLEAN CATCH   Final    Special Requests ADDED ON 03/25/12 AT 1658   Final    Culture  Setup Time 03/25/2012 20:29   Final    Colony Count >=100,000 COLONIES/ML   Final    Culture ESCHERICHIA COLI   Final    Report Status 03/27/2012 FINAL   Final    Organism ID, Bacteria ESCHERICHIA COLI   Final   MRSA PCR SCREENING     Status: Normal   Collection Time   03/25/12  8:31 PM      Component Value Range Status Comment   MRSA by PCR NEGATIVE  NEGATIVE Final      Labs: Basic Metabolic Panel:  Lab 03/26/12 2956  03/25/12 2323 03/25/12 1954 03/25/12 0710 03/24/12 2149  NA 131* 129* 132* 134* 134*  K 4.9 5.7* 6.6* 5.4* 5.5*  CL 96 97 98 97 96  CO2 20 16* 20 27 28   GLUCOSE 149* 237* 177* 108* 110*  BUN 43* 42* 39* 37* 34*  CREATININE 4.43* 4.15* 3.99* 3.55* 3.05*  CALCIUM 9.2 9.0 8.7 9.2 9.5  MG -- -- -- -- --  PHOS -- -- -- -- --   Liver Function Tests:  Lab 03/25/12 1954 03/25/12 0252  AST 46* 56*  ALT 30 19  ALKPHOS 85 72  BILITOT 0.4 0.4  PROT 6.5 6.1  ALBUMIN 3.1* 3.0*    Lab 03/25/12 1954 03/25/12 0252  LIPASE 28 23  AMYLASE -- --   No results found for this basename: AMMONIA:5 in the last 168 hours CBC:  Lab 03/25/12 1954 03/24/12 2149  WBC 15.9* 10.4  NEUTROABS -- 8.8*  HGB 13.1 12.8  HCT 40.2 38.2  MCV 96.2 93.6  PLT 201 161   Cardiac Enzymes:  Lab 03/25/12 1954 03/25/12 1629 03/25/12 1015 03/25/12 0710 03/24/12 2149  CKTOTAL 102 -- -- -- --  CKMB -- -- -- -- --  CKMBINDEX -- -- -- -- --  TROPONINI -- <0.30 <0.30 <0.30 <0.30   BNP: BNP (last 3 results)  Basename 03/24/12 2149  PROBNP 11270.0*   CBG:  Lab 03/25/12 1712  GLUCAP 150*    Time coordinating discharge: 25 minutes  Signed:  Evyn Putzier A  [facility] 03/29/2012, 8:43 AM

## 2012-03-29 NOTE — Progress Notes (Signed)
Patient discharged to [facility]. Patient was stable upon discharge. Report was called to [facility] before patients discharge. Order stated to leave IV in place however nurse at [facility] stated to take the IV out before discharge. IV was removed before discharge. Family was with patient upon discharge.

## 2012-03-29 NOTE — Progress Notes (Signed)
Subjective: Patient resting comfortably. Family by bedside, no specific complaints. Ready to go to [facility] place.  Objective: Vital signs in last 24 hours: Filed Vitals:   03/26/12 2029 03/27/12 0800 03/27/12 2057 03/28/12 2135  BP:   137/82 129/71  Pulse:   113 124  Temp:    95.7 F (35.4 C)  TempSrc: Oral Axillary Axillary Oral  Resp:   22 16  Height:      Weight:      SpO2:   95% 95%   Weight change:   Intake/Output Summary (Last 24 hours) at 03/29/12 0865 Last data filed at 03/28/12 0900  Gross per 24 hour  Intake      0 ml  Output      0 ml  Net      0 ml    Physical Exam: General: Sleepy, No acute distress. HEENT: EOMI. Neck: Supple CV: S1 and S2 Lungs: Cra Abdomen: Soft, Nontender, Nondistended, +bowel sounds. Ext: Good pulses. Trace edema.  Lab Results: Basic Metabolic Panel:  Lab 03/26/12 7846 03/25/12 2323 03/25/12 1954 03/25/12 0710 03/24/12 2149  NA 131* 129* 132* 134* 134*  K 4.9 5.7* 6.6* 5.4* 5.5*  CL 96 97 98 97 96  CO2 20 16* 20 27 28   GLUCOSE 149* 237* 177* 108* 110*  BUN 43* 42* 39* 37* 34*  CREATININE 4.43* 4.15* 3.99* 3.55* 3.05*  CALCIUM 9.2 9.0 8.7 9.2 9.5  MG -- -- -- -- --  PHOS -- -- -- -- --   Liver Function Tests:  Lab 03/25/12 1954 03/25/12 0252  AST 46* 56*  ALT 30 19  ALKPHOS 85 72  BILITOT 0.4 0.4  PROT 6.5 6.1  ALBUMIN 3.1* 3.0*    Lab 03/25/12 1954 03/25/12 0252  LIPASE 28 23  AMYLASE -- --   No results found for this basename: AMMONIA:5 in the last 168 hours CBC:  Lab 03/25/12 1954 03/24/12 2149  WBC 15.9* 10.4  NEUTROABS -- 8.8*  HGB 13.1 12.8  HCT 40.2 38.2  MCV 96.2 93.6  PLT 201 161   Cardiac Enzymes:  Lab 03/25/12 1954 03/25/12 1629 03/25/12 1015 03/25/12 0710 03/24/12 2149  CKTOTAL 102 -- -- -- --  CKMB -- -- -- -- --  CKMBINDEX -- -- -- -- --  TROPONINI -- <0.30 <0.30 <0.30 <0.30   BNP (last 3 results)  Basename 03/24/12 2149  PROBNP 11270.0*   CBG:  Lab 03/25/12 1712  GLUCAP 150*    No results found for this basename: HGBA1C:5 in the last 72 hours Other Labs: No components found with this basename: POCBNP:3 No results found for this basename: DDIMER:2 in the last 168 hours No results found for this basename: CHOL:2,HDL:2,LDLCALC:2,TRIG:2,CHOLHDL:2,LDLDIRECT:2 in the last 168 hours  Lab 03/26/12 1026 03/25/12 0252  TSH -- 1.054  T4TOTAL -- --  T3FREE -- --  FREET4 1.53 --  THYROIDAB -- --   No results found for this basename: VITAMINB12:2,FOLATE:2,FERRITIN:2,TIBC:2,IRON:2,RETICCTPCT:2 in the last 168 hours  Micro Results: Recent Results (from the past 240 hour(s))  URINE CULTURE     Status: Normal   Collection Time   03/25/12  4:46 AM      Component Value Range Status Comment   Specimen Description URINE, CLEAN CATCH   Final    Special Requests ADDED ON 03/25/12 AT 1658   Final    Culture  Setup Time 03/25/2012 20:29   Final    Colony Count >=100,000 COLONIES/ML   Final    Culture ESCHERICHIA COLI  Final    Report Status 03/27/2012 FINAL   Final    Organism ID, Bacteria ESCHERICHIA COLI   Final   MRSA PCR SCREENING     Status: Normal   Collection Time   03/25/12  8:31 PM      Component Value Range Status Comment   MRSA by PCR NEGATIVE  NEGATIVE Final     Studies/Results: No results found.  Medications: I have reviewed the patient's current medications. Scheduled Meds:   . LORazepam  1 mg Intravenous Q8H  . DISCONTD: LORazepam  1 mg Intravenous Q12H   Continuous Infusions:   . sodium chloride 10 mL/hr at 03/27/12 1352   PRN Meds:.acetaminophen, atropine, bisacodyl, fentaNYL, LORazepam, ondansetron (ZOFRAN) IV, ondansetron  Assessment/Plan: Acute renal failure in setting of solitary right kidney  no renal calculi or hydronephrosis were visualized on CT and renal duplex is negative for obstruction of the renal artery. Renal, Dr Arlean Hopping, does not suspect ATN. Not a prerenal state as giving IVF did not improve the creatinine, rapidly progressive  glomerulonephritis?  Family no longer wanting aggressive work up or dialysis, family wants comfort care only at this time.  Right sided flank/abdominal pain  Suspect this is related to her right kidney dysfunction/failure, at this point we will make her comfortable with pain medications.  Diastolic CHF  Fluid overloaded on CXR.  Alerted mental status- encephalopathy  PRN Ativan requested by family for agitation.   H/o renal cancer and resection of left kidney   Code Status: DNR  Family Communication: spoke with family at bedside  Disposition Plan: transfer to [facility] place today.   Consultants:  Palliative Care  Nephrology   Antibiotics:  Rocephin - 9/14- 9/15  Zithromax- 9/15   LOS: 5 days  Anvith Mauriello A, MD 03/29/2012, 8:22 AM

## 2012-03-30 NOTE — Consult Note (Signed)
I have reviewed and discussed the care of this patient in detail with the nurse practitioner including pertinent patient records, physical exam findings and data. I agree with details of this encounter.  

## 2012-04-01 NOTE — Progress Notes (Signed)
Agree with above [name]

## 2012-04-11 DEATH — deceased

## 2015-06-12 ENCOUNTER — Telehealth: Payer: Self-pay | Admitting: *Deleted

## 2015-06-12 NOTE — Telephone Encounter (Signed)
Opened in error
